# Patient Record
Sex: Female | Born: 1955 | Race: White | Hispanic: No | Marital: Single | Smoking: Never smoker
Health system: Southern US, Community
[De-identification: ages and names within clinical notes are randomized; demographics above are authoritative.]

## PROBLEM LIST (undated history)

## (undated) DIAGNOSIS — K625 Hemorrhage of anus and rectum: Secondary | ICD-10-CM

## (undated) DIAGNOSIS — K6289 Other specified diseases of anus and rectum: Secondary | ICD-10-CM

## (undated) DIAGNOSIS — K5900 Constipation, unspecified: Secondary | ICD-10-CM

## (undated) DIAGNOSIS — T7840XA Allergy, unspecified, initial encounter: Secondary | ICD-10-CM

## (undated) DIAGNOSIS — R011 Cardiac murmur, unspecified: Secondary | ICD-10-CM

## (undated) HISTORY — PX: TUBAL LIGATION: SHX77

## (undated) HISTORY — DX: Allergy, unspecified, initial encounter: T78.40XA

## (undated) HISTORY — DX: Hemorrhage of anus and rectum: K62.5

## (undated) HISTORY — DX: Constipation, unspecified: K59.00

## (undated) HISTORY — DX: Cardiac murmur, unspecified: R01.1

## (undated) HISTORY — DX: Other specified diseases of anus and rectum: K62.89

## (undated) HISTORY — PX: OTHER SURGICAL HISTORY: SHX169

---

## 1993-07-18 HISTORY — PX: DILATION AND CURETTAGE OF UTERUS: SHX78

## 1998-07-07 ENCOUNTER — Other Ambulatory Visit: Admission: RE | Admit: 1998-07-07 | Discharge: 1998-07-07 | Payer: Self-pay | Admitting: Obstetrics & Gynecology

## 1999-04-12 ENCOUNTER — Other Ambulatory Visit: Admission: RE | Admit: 1999-04-12 | Discharge: 1999-04-12 | Payer: Self-pay | Admitting: Obstetrics & Gynecology

## 1999-09-10 ENCOUNTER — Other Ambulatory Visit: Admission: RE | Admit: 1999-09-10 | Discharge: 1999-09-10 | Payer: Self-pay | Admitting: Obstetrics and Gynecology

## 1999-10-19 ENCOUNTER — Inpatient Hospital Stay (HOSPITAL_COMMUNITY): Admission: AD | Admit: 1999-10-19 | Discharge: 1999-10-19 | Payer: Self-pay | Admitting: Obstetrics & Gynecology

## 1999-11-18 ENCOUNTER — Encounter (INDEPENDENT_AMBULATORY_CARE_PROVIDER_SITE_OTHER): Payer: Self-pay | Admitting: Specialist

## 1999-11-18 ENCOUNTER — Inpatient Hospital Stay (HOSPITAL_COMMUNITY): Admission: AD | Admit: 1999-11-18 | Discharge: 1999-11-20 | Payer: Self-pay | Admitting: Family Medicine

## 1999-11-21 ENCOUNTER — Encounter: Admission: RE | Admit: 1999-11-21 | Discharge: 2000-02-19 | Payer: Self-pay | Admitting: Obstetrics & Gynecology

## 1999-12-21 ENCOUNTER — Other Ambulatory Visit: Admission: RE | Admit: 1999-12-21 | Discharge: 1999-12-21 | Payer: Self-pay | Admitting: Obstetrics and Gynecology

## 2000-12-15 ENCOUNTER — Other Ambulatory Visit: Admission: RE | Admit: 2000-12-15 | Discharge: 2000-12-15 | Payer: Self-pay | Admitting: Obstetrics & Gynecology

## 2002-03-22 ENCOUNTER — Other Ambulatory Visit: Admission: RE | Admit: 2002-03-22 | Discharge: 2002-03-22 | Payer: Self-pay | Admitting: Obstetrics & Gynecology

## 2003-08-22 ENCOUNTER — Other Ambulatory Visit: Admission: RE | Admit: 2003-08-22 | Discharge: 2003-08-22 | Payer: Self-pay | Admitting: Obstetrics & Gynecology

## 2004-07-18 HISTORY — PX: COLONOSCOPY: SHX174

## 2004-10-20 ENCOUNTER — Other Ambulatory Visit: Admission: RE | Admit: 2004-10-20 | Discharge: 2004-10-20 | Payer: Self-pay | Admitting: Obstetrics & Gynecology

## 2004-12-10 ENCOUNTER — Ambulatory Visit: Payer: Self-pay | Admitting: Internal Medicine

## 2004-12-16 ENCOUNTER — Ambulatory Visit: Payer: Self-pay | Admitting: Internal Medicine

## 2004-12-16 LAB — HM COLONOSCOPY: HM Colonoscopy: NORMAL

## 2004-12-21 ENCOUNTER — Ambulatory Visit: Payer: Self-pay | Admitting: Gastroenterology

## 2005-01-05 ENCOUNTER — Ambulatory Visit: Payer: Self-pay | Admitting: Gastroenterology

## 2005-02-03 ENCOUNTER — Ambulatory Visit: Payer: Self-pay | Admitting: Gastroenterology

## 2005-09-26 ENCOUNTER — Ambulatory Visit: Payer: Self-pay | Admitting: Internal Medicine

## 2005-11-29 ENCOUNTER — Ambulatory Visit: Payer: Self-pay | Admitting: Internal Medicine

## 2005-12-27 ENCOUNTER — Ambulatory Visit: Payer: Self-pay | Admitting: Internal Medicine

## 2005-12-30 ENCOUNTER — Ambulatory Visit: Payer: Self-pay | Admitting: Family Medicine

## 2006-08-30 ENCOUNTER — Ambulatory Visit: Payer: Self-pay | Admitting: Internal Medicine

## 2007-03-05 ENCOUNTER — Telehealth (INDEPENDENT_AMBULATORY_CARE_PROVIDER_SITE_OTHER): Payer: Self-pay | Admitting: *Deleted

## 2007-03-05 ENCOUNTER — Ambulatory Visit: Payer: Self-pay | Admitting: Internal Medicine

## 2007-03-06 ENCOUNTER — Encounter: Payer: Self-pay | Admitting: Internal Medicine

## 2007-03-08 ENCOUNTER — Encounter (INDEPENDENT_AMBULATORY_CARE_PROVIDER_SITE_OTHER): Payer: Self-pay | Admitting: *Deleted

## 2007-05-21 ENCOUNTER — Ambulatory Visit: Payer: Self-pay | Admitting: Internal Medicine

## 2007-07-05 ENCOUNTER — Ambulatory Visit: Payer: Self-pay | Admitting: Internal Medicine

## 2007-07-05 ENCOUNTER — Telehealth (INDEPENDENT_AMBULATORY_CARE_PROVIDER_SITE_OTHER): Payer: Self-pay | Admitting: *Deleted

## 2008-09-17 ENCOUNTER — Ambulatory Visit: Payer: Self-pay | Admitting: Internal Medicine

## 2008-09-17 ENCOUNTER — Telehealth (INDEPENDENT_AMBULATORY_CARE_PROVIDER_SITE_OTHER): Payer: Self-pay | Admitting: *Deleted

## 2008-10-01 ENCOUNTER — Ambulatory Visit: Payer: Self-pay | Admitting: Internal Medicine

## 2008-10-07 ENCOUNTER — Encounter (INDEPENDENT_AMBULATORY_CARE_PROVIDER_SITE_OTHER): Payer: Self-pay | Admitting: *Deleted

## 2008-11-04 ENCOUNTER — Encounter (INDEPENDENT_AMBULATORY_CARE_PROVIDER_SITE_OTHER): Payer: Self-pay | Admitting: *Deleted

## 2008-11-04 LAB — CONVERTED CEMR LAB
Cholesterol: 206 mg/dL — ABNORMAL HIGH (ref 0–200)
Direct LDL: 127.9 mg/dL
Total CHOL/HDL Ratio: 3
VLDL: 14.4 mg/dL (ref 0.0–40.0)

## 2008-11-25 ENCOUNTER — Ambulatory Visit: Payer: Self-pay | Admitting: Internal Medicine

## 2008-11-25 DIAGNOSIS — L255 Unspecified contact dermatitis due to plants, except food: Secondary | ICD-10-CM

## 2008-12-01 ENCOUNTER — Telehealth (INDEPENDENT_AMBULATORY_CARE_PROVIDER_SITE_OTHER): Payer: Self-pay | Admitting: *Deleted

## 2008-12-12 ENCOUNTER — Telehealth (INDEPENDENT_AMBULATORY_CARE_PROVIDER_SITE_OTHER): Payer: Self-pay | Admitting: *Deleted

## 2010-01-12 ENCOUNTER — Ambulatory Visit: Payer: Self-pay | Admitting: Internal Medicine

## 2010-03-01 ENCOUNTER — Encounter: Admission: RE | Admit: 2010-03-01 | Discharge: 2010-03-01 | Payer: Self-pay | Admitting: Obstetrics & Gynecology

## 2010-04-29 ENCOUNTER — Ambulatory Visit: Payer: Self-pay | Admitting: Internal Medicine

## 2010-04-29 DIAGNOSIS — R5381 Other malaise: Secondary | ICD-10-CM | POA: Insufficient documentation

## 2010-04-29 DIAGNOSIS — R5383 Other fatigue: Secondary | ICD-10-CM

## 2010-04-29 DIAGNOSIS — R490 Dysphonia: Secondary | ICD-10-CM

## 2010-04-30 LAB — CONVERTED CEMR LAB
Basophils Absolute: 0 10*3/uL (ref 0.0–0.1)
Eosinophils Relative: 1.3 % (ref 0.0–5.0)
HCT: 36.2 % (ref 36.0–46.0)
Hemoglobin: 11.9 g/dL — ABNORMAL LOW (ref 12.0–15.0)
Lymphs Abs: 1.6 10*3/uL (ref 0.7–4.0)
Monocytes Relative: 6 % (ref 3.0–12.0)
Neutro Abs: 4.5 10*3/uL (ref 1.4–7.7)
RDW: 16.3 % — ABNORMAL HIGH (ref 11.5–14.6)
Saturation Ratios: 12.4 % — ABNORMAL LOW (ref 20.0–50.0)
Vitamin B-12: 280 pg/mL (ref 211–911)

## 2010-06-04 ENCOUNTER — Encounter: Admission: RE | Admit: 2010-06-04 | Discharge: 2010-06-04 | Payer: Self-pay | Admitting: Obstetrics & Gynecology

## 2010-08-08 ENCOUNTER — Encounter: Payer: Self-pay | Admitting: Obstetrics & Gynecology

## 2010-08-17 NOTE — Assessment & Plan Note (Signed)
Summary: hoarse throat/cbs   Vital Signs:  Patient profile:   55 year old female Weight:      135.8 pounds Temp:     98.0 degrees F oral Pulse rate:   72 / minute Resp:     15 per minute BP sitting:   132 / 80  (left arm) Cuff size:   regular  Vitals Entered By: Shonna Chock CMA (April 29, 2010 1:35 PM) CC: Hoarse, URI symptoms   CC:  Hoarse and URI symptoms.  History of Present Illness:      This is a 55 year old woman who presents with hoarsenessX 2 weeks. The patient reports nasal congestion, sore throat(1 day only ), and productive cough with clear sputum, but denies purulent nasal discharge and earache but some pressure.  The patient denies fever, dyspnea, and wheezing.  The patient denies headache.  The patient denies the following risk factors for Strep sinusitis: unilateral facial pain, tooth pain, and tender adenopathy.  Rx: Neti pot , Nyquil , Zyrtec. Additionally she has had fatigue for months; she is a blood donor.  Current Medications (verified): 1)  None  Allergies (verified): No Known Drug Allergies  Review of Systems General:  Denies chills and sweats. Eyes:  Denies blurring, double vision, and vision loss-both eyes. ENT:  Denies difficulty swallowing. CV:  Denies palpitations. GI:  Denies constipation, diarrhea, and indigestion. Derm:  Denies changes in nail beds, dryness, and hair loss. Neuro:  Denies numbness and tingling. Endo:  Denies cold intolerance and heat intolerance. Allergy:  Denies itching eyes and sneezing.  Physical Exam  General:  well-nourished,in no acute distress; alert,appropriate and cooperative throughout examination Eyes:  No corneal or conjunctival inflammation noted. EOMI. Perrla. Vision grossly normal.No lid lag Ears:  External ear exam shows no significant lesions or deformities.  Otoscopic examination reveals clear canals, tympanic membranes are intact bilaterally without bulging, retraction, inflammation or discharge. Hearing  is grossly normal bilaterally. Nose:  External nasal examination shows no deformity or inflammation. Nasal mucosa are pink and moist without lesions or exudates. Mouth:  Oral mucosa and oropharynx without lesions or exudates.  Teeth in good repair.Slight hoarseness Lungs:  Normal respiratory effort, chest expands symmetrically. Lungs are clear to auscultation, no crackles or wheezes. Heart:  Normal rate and regular rhythm. S1 and S2 normal without gallop, murmur, click, rub .S4 Abdomen:  Bowel sounds positive,abdomen soft and non-tender without masses, organomegaly or hernias noted. Neurologic:  alert & oriented X3 and DTRs symmetrical and normal.  No tremor Skin:  Intact without suspicious lesions or rashes Cervical Nodes:  No lymphadenopathy noted Psych:  memory intact for recent and remote, normally interactive, and good eye contact.     Impression & Recommendations:  Problem # 1:  HOARSENESS (UJW-119.14)  Problem # 2:  BRONCHITIS-ACUTE (ICD-466.0)  Her updated medication list for this problem includes:    Azithromycin 250 Mg Tabs (Azithromycin) .Marland Kitchen... As per pack    Asmanex 14 Metered Doses 220 Mcg/inh Aepb (Mometasone furoate) .Marland Kitchen... 1 inhalation two times a day ; gargle  & after use  Problem # 3:  FATIGUE (ICD-780.79)  Orders: Venipuncture (78295) Specimen Handling (62130) TLB-B12 + Folate Pnl (86578_46962-X52/WUX) TLB-IBC Pnl (Iron/FE;Transferrin) (83550-IBC) TLB-CBC Platelet - w/Differential (85025-CBCD) TLB-TSH (Thyroid Stimulating Hormone) (84443-TSH)  Complete Medication List: 1)  Azithromycin 250 Mg Tabs (Azithromycin) .... As per pack 2)  Asmanex 14 Metered Doses 220 Mcg/inh Aepb (Mometasone furoate) .Marland Kitchen.. 1 inhalation two times a day ; gargle  & after use  Other Orders: Admin 1st Vaccine (29562) Flu Vaccine 53yrs + [phone])  Patient Instructions: 1)  Drink as much NON dairy  fluid as you can tolerate for the next few days. Voice rest as  discussed. Prescriptions: ASMANEX 14 METERED DOSES 220 MCG/INH AEPB (MOMETASONE FUROATE) 1 inhalation two times a day ; gargle  & after use  #sample x 0   Entered and Authorized by:   Marga Melnick MD   Signed by:   Marga Melnick MD on 04/29/2010   Method used:   Samples Given   RxID:   [id number] AZITHROMYCIN 250 MG TABS (AZITHROMYCIN) as per pack  #1 x 0   Entered and Authorized by:   Marga Melnick MD   Signed by:   Marga Melnick MD on 04/29/2010   Method used:   Faxed to ...       [facility] [phone]* (retail)       [address]       [place], [place]  [address]       Ph: [phone]       Fax: [phone]   RxID:   [id number]  Flu Vaccine Consent Questions     Do you have a history of severe allergic reactions to this vaccine? no    Any prior history of allergic reactions to egg and/or gelatin? no    Do you have a sensitivity to the preservative Thimersol? no    Do you have a past history of Guillan-Barre Syndrome? no    Do you currently have an acute febrile illness? no    Have you ever had a severe reaction to latex? no    Vaccine information given and explained to patient? yes    Are you currently pregnant? no    Lot Number:[id number]   Exp Date:01/15/2011   Site Given  Left Deltoid [address]       [place], [place]  [address]       Ph: [phone]       Fax: [phone]   RxID:   [id number]  .lbflu

## 2010-12-03 NOTE — Discharge Summary (Signed)
[facility]  Patient:    Karen Gilmore, Karen Gilmore                          MRN: [id number] Adm. Date:  [id number] Disc. Date: [id number] Attending:  Osborn Coho Dictator:   Leilani Able, P.A.                           Discharge Summary  FINAL DIAGNOSES:              1. Spontaneous vaginal delivery of a female infant                                  with Apgars of 7 and 9.                               2. Desires postpartum sterilization.  PROCEDURE:                    Vaginal delivery and postpartum bilateral tubal ligation.  SURGEON:                      Miguel Aschoff, M.D.  COMPLICATIONS:                None.  HISTORY OF PRESENT ILLNESS:   This 55 year old, gravida 3, para 1-0-1-1, presents at 38 weeks in early labor with spontaneous rupture of membranes.  The patients  prenatal course has been complicated by advanced maternal age.  She did have an  amniocentesis performed with a 46XX genotype.  At this point, the patient is admitted and started on antibiotics for prolonged spontaneous rupture of membranes. The patient had a normal labor curve with Pitocin augmentation.  She had spontaneous vaginal delivery of a 6 pound 3 ounce female infant with Apgars of  and 9 over a second degree perineal laceration.  The delivery went without complications.  The patient still expressed her desires for a postpartum tubal ligation and the complications were discussed with the patient.  She decided to  proceed.  HOSPITAL COURSE:              The patient was taken to the operating room on Nov 19, 1999, by Miguel Aschoff, M.D. where a postpartum Pomeroy tubal sterilization procedure was performed.  The procedure went without complications and the patients postpartum and postoperative course were benign without significant fevers. She was felt ready for discharge on postpartum day #2.  She was sent home on a regular diet, told to decrease activities, told to  continue prenatal vitamins and FeSO4. She was given Darvocet-N 100 one to two every four hours as needed for pain. She was told to follow up in the office in four weeks.  DISCHARGE LABORATORY DATA:    The patient had a hemoglobin of 11.9, white blood  cell count of 14.5. DD:  12/06/99 TD:  12/07/99 Job: [id number] MW/NU272

## 2010-12-03 NOTE — Op Note (Signed)
[facility]  Patient:    Karen Gilmore, Karen Gilmore                          MRN: [id number] Proc. Date: 11/19/99 Adm. Date:  [id number] Attending:  Osborn Coho                           Operative Report  PREOPERATIVE DIAGNOSIS:       Desired sterilization postpartum.  POSTOPERATIVE DIAGNOSIS:      Desired sterilization postpartum.  OPERATION:                    Postpartum Pomeroy tubal sterilization.  SURGEON:                      Miguel Aschoff, M.D.  ASSISTANT:  ANESTHESIA:                   Epidural.  COMPLICATIONS:                None.  ESTIMATED BLOOD LOSS:  INDICATIONS:                  The patient is a 55 year old white female, gravida 3, para 2-0-1-2, who delivered by March 2001.  The patient requested sterilization  procedure for socioeconomic reasons.  She understands the risks and limitations for this procedure and has given her informed consent for postpartum tubal sterilization.  DESCRIPTION OF PROCEDURE:     The patient was taken to the operating room.  Her  previously placed epidural catheter was then reinjected and a satisfactory level of anesthesia was achieved without difficulty.  After this was done, a small infraumbilical incision was made.  The incision was extended down through the subcutaneous tissue with bleeding points being clamped and coagulated as they were encountered.  The fascia was then opened and the peritoneum was revealed below. On entering the peritoneal cavity using [employer] retractors, it was possible to bring the left tube into view and the tube was traced out to its fimbriated end for positive identification.  It was then grasped in its midportion and then this knuckle of tube was ligated with two ligatures of 0 plain gut.  The tube above he ligatures was excised and the tubal stumps were cauterized.  An identical procedure was then carried out on the right side with the tube being  positively identified, doubly ligated, and then the portion of tube excised and tubal stumps cauterized. At this point with excellent hemostasis, it was elected to complete the procedure. Lap counts were taken and found to be correct.  The parietoperitoneum was closed using pursestring suture of 0 Vicryl.  The fascia was closed using running continuous 0 Vicryl suture.  The subcutaneous tissue was closed using interrupted 0 Vicryl suture and the skin incision was closed using subcuticular 4-0 Vicryl suture.  The estimated blood loss was less than 20 cc.  The patient tolerated the procedure well and went to the recovery room in satisfactory condition. DD:  11/19/99 TD:  11/20/99 Job: [id number] EX/[id number]

## 2011-01-07 ENCOUNTER — Encounter: Payer: Self-pay | Admitting: Internal Medicine

## 2011-01-07 ENCOUNTER — Ambulatory Visit (INDEPENDENT_AMBULATORY_CARE_PROVIDER_SITE_OTHER): Payer: BC Managed Care – PPO | Admitting: Internal Medicine

## 2011-01-07 VITALS — BP 116/78 | HR 60 | Temp 97.7°F | Wt 134.4 lb

## 2011-01-07 DIAGNOSIS — R21 Rash and other nonspecific skin eruption: Secondary | ICD-10-CM

## 2011-01-07 MED ORDER — FAMCICLOVIR 500 MG PO TABS: 500.0000 mg | ORAL_TABLET | Freq: Three times a day (TID) | ORAL | Status: AC

## 2011-01-07 NOTE — Patient Instructions (Signed)
Fill Famvir Rx if rash progresses with Cort Aid twice a day as needed

## 2011-01-07 NOTE — Progress Notes (Signed)
  Subjective:    Patient ID: Karen Gilmore, female    DOB: June 23, 1956, 55 y.o.   MRN: [id number]  HPI RASH Location: mid back  Onset: 6/21 as itching   Course: stable Self-treated with: no treatment              History Pruritis: yes,   Tenderness: no  New medications/antibiotics: no  Tick/insect/pet exposure: no  Recent travel: no  New detergent, new clothing, or other topical exposure: no; she saw [facility]  In yard 6/20 Arthralgias: no GU symptoms : no  Red Flags Feeling ill: no  Fever: no  Mouth lesions: no  Facial/tongue swelling/difficulty breathing:  no  Diabetic or immunocompromised: no       Review of Systems     Objective:   Physical Exam Gen.: Healthy and well-nourished in appearance. Alert, appropriate and cooperative throughout exam. Eyes: No corneal or conjunctival inflammation noted.Lungs: Normal respiratory effort; chest expands symmetrically. Lungs are clear to auscultation without rales, wheezes, or increased work of breathing. Heart: Normal rate and rhythm. Normal S1 and S2. No gallop, click, or rub. S4, no murmur. Abdomen: Bowel sounds normal; abdomen soft and nontender. No masses, organomegaly or hernias noted. Lymph: No cervical, or axillary  lymphadenopathy present. Psych: Mood and affect are normal. Normally interactive    Skin: 2.5 X 0.5 cm erythematous papules @ L T10-11 dermatome                                                                                       Assessment & Plan:  #1 papular rash, probable zoster Plan:Famvir if progressive; CortAid twice a day initially

## 2011-05-11 ENCOUNTER — Ambulatory Visit (INDEPENDENT_AMBULATORY_CARE_PROVIDER_SITE_OTHER): Payer: BC Managed Care – PPO

## 2011-05-11 DIAGNOSIS — Z23 Encounter for immunization: Secondary | ICD-10-CM

## 2011-07-22 ENCOUNTER — Ambulatory Visit (INDEPENDENT_AMBULATORY_CARE_PROVIDER_SITE_OTHER): Payer: BC Managed Care – PPO | Admitting: Internal Medicine

## 2011-07-22 ENCOUNTER — Ambulatory Visit (HOSPITAL_BASED_OUTPATIENT_CLINIC_OR_DEPARTMENT_OTHER)
Admission: RE | Admit: 2011-07-22 | Discharge: 2011-07-22 | Disposition: A | Discharge status: Home / Self Care | Payer: BC Managed Care – PPO | Source: Ambulatory Visit | Attending: Internal Medicine | Admitting: Internal Medicine

## 2011-07-22 ENCOUNTER — Encounter: Payer: Self-pay | Admitting: Internal Medicine

## 2011-07-22 DIAGNOSIS — J209 Acute bronchitis, unspecified: Secondary | ICD-10-CM

## 2011-07-22 DIAGNOSIS — J40 Bronchitis, not specified as acute or chronic: Secondary | ICD-10-CM

## 2011-07-22 DIAGNOSIS — R079 Chest pain, unspecified: Secondary | ICD-10-CM | POA: Insufficient documentation

## 2011-07-22 DIAGNOSIS — R0789 Other chest pain: Secondary | ICD-10-CM

## 2011-07-22 LAB — CBC WITH DIFFERENTIAL/PLATELET
Basophils Absolute: 0 10*3/uL (ref 0.0–0.1)
Eosinophils Absolute: 0.2 10*3/uL (ref 0.0–0.7)
HCT: 36.1 % (ref 36.0–46.0)
Hemoglobin: 11.9 g/dL — ABNORMAL LOW (ref 12.0–15.0)
Lymphs Abs: 1.7 10*3/uL (ref 0.7–4.0)
MCHC: 33.1 g/dL (ref 30.0–36.0)
MCV: 90.7 fl (ref 78.0–100.0)
Monocytes Absolute: 0.5 10*3/uL (ref 0.1–1.0)
Neutro Abs: 3.4 10*3/uL (ref 1.4–7.7)
RDW: 15.8 % — ABNORMAL HIGH (ref 11.5–14.6)

## 2011-07-22 MED ORDER — HYDROCODONE-HOMATROPINE 5-1.5 MG/5ML PO SYRP: 5.0000 mL | ORAL_SOLUTION | Freq: Four times a day (QID) | ORAL | Status: AC | PRN

## 2011-07-22 MED ORDER — AZITHROMYCIN 250 MG PO TABS: ORAL_TABLET | ORAL | Status: AC

## 2011-07-22 NOTE — Progress Notes (Signed)
  Subjective:    Patient ID: Karen Gilmore, female    DOB: July 21, 1955, 56 y.o.   MRN: [id number]  HPI CHEST PAIN: Location: SS  Quality: mainly a  "constant" dull but intermittent "wave" of "gripping " pain "like labor pain" Duration: gripping pain < 1 min  Onset : 1/3 @ rest Radiation:no  Better with: no relievers  Worse with: no exacerbating factors Symptoms History of Trauma/lifting: no  Nausea/vomiting: no  Diaphoresis: no  Shortness of breath: yes, suboptimal breath  Pleuritic: no  Cough:yes;onset 1/2, initially dry but now sputum green Edema: no  Orthopnea: yes, see above  PND: no Dizziness: yes, slightly  Palpitations: no  Syncope: no  Indigestion: no . She also denies dysphagia, melena, or rectal bleeding.  Red Flags Worse with exertion: not exerting  Recent Immobility: yes, in bed 1/2 & part of 1/2 & 1/3  Tearing/radiation to back: yes, occasionally       Review of Systems earlier this week she did have some purulent nasal discharge. She denies frontal headache, facial pain, ear pain, or discharge.     Objective:   Physical Exam General appearance:good health ;well nourished; no acute distress or increased work of breathing is present.  No  lymphadenopathy about the head, neck, or axilla noted.   Eyes: No conjunctival inflammation or lid edema is present.   Ears:  External ear exam shows no significant lesions or deformities.  Otoscopic examination reveals clear canals, tympanic membranes are intact bilaterally without bulging, retraction, inflammation or discharge.  Nose:  External nasal examination shows no deformity or inflammation. Nasal mucosa are dry without lesions or exudates. No septal dislocation or deviation.No obstruction to airflow.   Oral exam: Dental hygiene is good; lips and gums are healthy appearing.There is no oropharyngeal erythema or exudate noted.      Heart:  Normal rate and regular rhythm. S1 and S2 normal without gallop, murmur,  rub or  other extra sounds. Slight click left lower sternal border  Lungs:Chest clear to auscultation; no wheezes, rhonchi,rales ,or rubs present.No increased work of breathing.    Vascular: All pulses intact; no bruits or deficits  Extremities:  No cyanosis, edema, or clubbing  noted. Homans sign negative    Skin: Warm & dry w/o jaundice or tenting.          Assessment & Plan:  #1 bronchitis, acute with intermittent purulent sputum. Minor upper respiratory tract symptoms without definitive rhinosinusitis  #2 atypical chest pain; most likely related to cough. There is family history of premature coronary disease. EKG is normal.  Plan: See orders and recommendations

## 2011-07-22 NOTE — Patient Instructions (Signed)
To ER if pain persists or is associaled with Warning Signs as discussed.  Order for x-rays entered into  the computer; these will be performed at [facility]. No appointment is necessary.

## 2012-05-28 ENCOUNTER — Ambulatory Visit (INDEPENDENT_AMBULATORY_CARE_PROVIDER_SITE_OTHER): Payer: BC Managed Care – PPO | Admitting: Internal Medicine

## 2012-05-28 ENCOUNTER — Encounter: Payer: Self-pay | Admitting: Internal Medicine

## 2012-05-28 VITALS — BP 118/80 | HR 70 | Temp 98.4°F | Resp 12 | Ht 65.5 in | Wt 138.4 lb

## 2012-05-28 DIAGNOSIS — R5383 Other fatigue: Secondary | ICD-10-CM

## 2012-05-28 DIAGNOSIS — Z23 Encounter for immunization: Secondary | ICD-10-CM

## 2012-05-28 DIAGNOSIS — E785 Hyperlipidemia, unspecified: Secondary | ICD-10-CM

## 2012-05-28 DIAGNOSIS — R5381 Other malaise: Secondary | ICD-10-CM

## 2012-05-28 NOTE — Progress Notes (Signed)
Subjective:    Patient ID: Karen Gilmore, female    DOB: July 06, 1956, 56 y.o.   MRN: [id number]  HPI  Karen Gilmore is here for a physical;acute issues include intermittent fatigue.      Review of Systems Patient reports no significant  vision/ hearing  changes, adenopathy,fever, weight change,  , swallowing issues, chest pain,palpitations,edema,persistant /recurrent cough, hemoptysis, dyspnea( rest/ exertional/paroxysmal nocturnal), gastrointestinal bleeding(melena, rectal bleeding), abdominal pain, significant heartburn,  bowel changes,GU symptoms(dysuria, hematuria,pyuria, incontinence), Gyn symptoms(abnormal  bleeding , pain),  syncope, focal weakness, memory loss, skin/hair /nail changes,abnormal bruising or bleeding, anxiety,or depression.   She experiences intermittent numbness & tingling in her fingers after sleeping. She relates her fatigue possibly to the demands of her job. 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 Alert, appropriate and cooperative throughout exam. Head: Normocephalic without obvious abnormalities  Eyes: No corneal or conjunctival inflammation noted. Pupils equal round reactive to light and accommodation. Fundal exam is benign without hemorrhages, exudate, papilledema. Extraocular motion intact. Vision grossly normal. Ears: External  ear exam reveals no significant lesions or deformities. Canals clear .TMs normal. Hearing is grossly normal bilaterally. Nose: External nasal exam reveals no deformity or inflammation. Nasal mucosa are pink and moist. No lesions or exudates noted.  Mouth: Oral mucosa and oropharynx reveal no lesions or exudates. Teeth in good repair. Neck: No deformities, masses, or tenderness noted. Range of motion & Thyroid normal. Lungs: Normal respiratory effort; chest expands symmetrically. Lungs are  clear to auscultation without rales, wheezes, or increased work of breathing. Heart: Normal rate and rhythm. Normal S1 and S2. No gallop, click, or rub. S 4 w/o murmur. Abdomen: Bowel sounds normal; abdomen soft and nontender. No masses, organomegaly or hernias noted. Genitalia: Dr Arlyce Dice, Gyn                                                                                   Musculoskeletal/extremities: No deformity or scoliosis noted of  the thoracic or lumbar spine. No clubbing, cyanosis, edema, or deformity noted. Range of motion  normal .Tone & strength  normal.Joints normal. Nail health  good. She is minor crepitus of the left knee without effusion Vascular: Carotid, radial artery, dorsalis pedis and  posterior tibial pulses are full and equal. No bruits present. Neurologic: Alert and oriented x3. Deep tendon reflexes symmetrical and normal.          Skin: Intact without suspicious lesions or rashes. Lymph: No cervical, axillary lymphadenopathy present. Psych: Mood and affect are normal. Normally interactive  Assessment & Plan:  #1 comprehensive physical exam; no acute findings #2 fatigue; probably job-related. No localizing symptoms or signs present Plan: see Orders

## 2012-05-28 NOTE — Patient Instructions (Addendum)
Preventive Health Care: Exercise  30-45  minutes a day, 3-4 days a week. Walking is especially valuable in preventing Osteoporosis. Eat a low-fat diet with lots of fruits and vegetables, up to 7-9 servings per day. Consume less than 30 grams of sugar per day from foods & drinks with High Fructose Corn Syrup as #1,2,3 or #4 on label. Please  consider fasting Labs : BMET,Lipids, hepatic panel, CBC & dif, TSH, free T4. PLEASE BRING THESE INSTRUCTIONS TO FOLLOW UP  LAB APPOINTMENT.This will guarantee correct labs are drawn, eliminating need for repeat blood sampling ( needle sticks ! ). Diagnoses /Codes: V70.0.  If you activate My Chart; the results can be released to you as soon as they populate from the lab. If you choose not to use this program; the labs have to be reviewed, copied & mailed   causing a delay in getting the results to you.

## 2012-05-31 ENCOUNTER — Other Ambulatory Visit (INDEPENDENT_AMBULATORY_CARE_PROVIDER_SITE_OTHER): Payer: BC Managed Care – PPO

## 2012-05-31 DIAGNOSIS — Z0000 Encounter for general adult medical examination without abnormal findings: Secondary | ICD-10-CM

## 2012-05-31 LAB — CBC WITH DIFFERENTIAL/PLATELET
Basophils Absolute: 0.1 10*3/uL (ref 0.0–0.1)
Basophils Relative: 1 % (ref 0.0–3.0)
HCT: 40.8 % (ref 36.0–46.0)
Hemoglobin: 13.2 g/dL (ref 12.0–15.0)
Lymphs Abs: 1.5 10*3/uL (ref 0.7–4.0)
MCHC: 32.3 g/dL (ref 30.0–36.0)
Monocytes Relative: 7.2 % (ref 3.0–12.0)
Neutro Abs: 3.1 10*3/uL (ref 1.4–7.7)
RBC: 4.42 Mil/uL (ref 3.87–5.11)
RDW: 16.9 % — ABNORMAL HIGH (ref 11.5–14.6)

## 2012-05-31 LAB — HEPATIC FUNCTION PANEL
Albumin: 4 g/dL (ref 3.5–5.2)
Alkaline Phosphatase: 66 U/L (ref 39–117)
Bilirubin, Direct: 0 mg/dL (ref 0.0–0.3)

## 2012-05-31 LAB — BASIC METABOLIC PANEL
BUN: 14 mg/dL (ref 6–23)
Chloride: 103 mEq/L (ref 96–112)
Glucose, Bld: 88 mg/dL (ref 70–99)
Potassium: 3.7 mEq/L (ref 3.5–5.1)

## 2012-05-31 LAB — T3, FREE: T3, Free: 3 pg/mL (ref 2.3–4.2)

## 2012-05-31 LAB — LDL CHOLESTEROL, DIRECT: Direct LDL: 128.2 mg/dL

## 2012-05-31 LAB — LIPID PANEL
HDL: 66.8 mg/dL (ref >39.00)
Total CHOL/HDL Ratio: 3
VLDL: 16.4 mg/dL (ref 0.0–40.0)

## 2012-06-04 ENCOUNTER — Other Ambulatory Visit: Payer: BC Managed Care – PPO

## 2012-06-04 ENCOUNTER — Other Ambulatory Visit: Payer: Self-pay | Admitting: Obstetrics & Gynecology

## 2012-06-04 DIAGNOSIS — R928 Other abnormal and inconclusive findings on diagnostic imaging of breast: Secondary | ICD-10-CM

## 2012-06-08 ENCOUNTER — Other Ambulatory Visit: Payer: BC Managed Care – PPO

## 2012-06-15 ENCOUNTER — Other Ambulatory Visit: Payer: BC Managed Care – PPO

## 2012-06-15 ENCOUNTER — Ambulatory Visit
Admission: RE | Admit: 2012-06-15 | Discharge: 2012-06-15 | Disposition: A | Discharge status: Home / Self Care | Payer: BC Managed Care – PPO | Source: Ambulatory Visit | Attending: Obstetrics & Gynecology | Admitting: Obstetrics & Gynecology

## 2012-06-15 DIAGNOSIS — R928 Other abnormal and inconclusive findings on diagnostic imaging of breast: Secondary | ICD-10-CM

## 2012-09-04 ENCOUNTER — Ambulatory Visit (INDEPENDENT_AMBULATORY_CARE_PROVIDER_SITE_OTHER): Payer: BC Managed Care – PPO | Admitting: Family Medicine

## 2012-09-04 ENCOUNTER — Encounter: Payer: Self-pay | Admitting: Family Medicine

## 2012-09-04 VITALS — BP 112/62 | HR 80 | Temp 98.4°F | Wt 132.2 lb

## 2012-09-04 DIAGNOSIS — K648 Other hemorrhoids: Secondary | ICD-10-CM

## 2012-09-04 DIAGNOSIS — K644 Residual hemorrhoidal skin tags: Secondary | ICD-10-CM

## 2012-09-04 MED ORDER — HYDROCORTISONE ACETATE 25 MG RE SUPP: 25.0000 mg | Freq: Two times a day (BID) | RECTAL | Status: DC

## 2012-09-04 MED ORDER — HYDROCORTISONE ACE-PRAMOXINE 1-1 % RE FOAM: 1.0000 | Freq: Two times a day (BID) | RECTAL | Status: DC

## 2012-09-04 NOTE — Patient Instructions (Signed)

## 2012-09-04 NOTE — Assessment & Plan Note (Signed)
Refer to surgery for hemorrhoidectomy anusol and proctofoam rx

## 2012-09-04 NOTE — Progress Notes (Signed)
  Subjective:    Patient ID: Karen Gilmore, female    DOB: 05/17/1956, 57 y.o.   MRN: [id number]  HPI Pt here c/o hx of int and ext hemorrhoids that bleed on occasion but have gotten bigger and are really uncomfortable now.  She doesn't like talking about it so she is very uncomfortable. No other complaints. Pt has been inc fiber in her diet and drinking water.     Review of Systems    as above Objective:   Physical Exam BP 112/62  Pulse 80  Temp(Src) 98.4 F (36.9 C) (Oral)  Wt 132 lb 3.2 oz (59.966 kg)  BMI 21.66 kg/m2  SpO2 98% General appearance: alert, cooperative, appears stated age and mild distress Rectal--  Large ext hemorrhoids, no bleeding now               DRE not attempted because of size of hemorrhoids and how uncomfortable pt is.       Assessment & Plan:

## 2012-09-05 ENCOUNTER — Ambulatory Visit (INDEPENDENT_AMBULATORY_CARE_PROVIDER_SITE_OTHER): Payer: BC Managed Care – PPO | Admitting: General Surgery

## 2012-09-05 ENCOUNTER — Encounter (INDEPENDENT_AMBULATORY_CARE_PROVIDER_SITE_OTHER): Payer: Self-pay | Admitting: General Surgery

## 2012-09-05 VITALS — BP 122/72 | HR 68 | Temp 98.0°F | Resp 16 | Ht 64.5 in | Wt 130.0 lb

## 2012-09-05 DIAGNOSIS — K645 Perianal venous thrombosis: Secondary | ICD-10-CM

## 2012-09-05 NOTE — Progress Notes (Signed)
Patient ID: Karen Gilmore, female   DOB: Oct 05, 1955, 57 y.o.   MRN: [id number]  Chief Complaint  Patient presents with  . Rectal Pain    Evaluate hemorrhoids    HPI Karen Gilmore is a 57 y.o. female.  This patient is referred by Dr. Laury Axon for evaluation of thrombosed hemorrhoids. This patient has a history of long-standing hemorrhoids which do cause some intermittent discomfort and flareups of pain but she's had these for quite some time. She says that she usually has some discomfort and bulging of her hemorrhoids after bowel movements and about once per year will have increased pain and bulging but this resolves. She is normally constipated but over the last few weeks has increased her fiber intake and water intake and she says her bowels have improved moving her bowels about once a day or at least every other day he however, despite improvement in her bowel habits, she still has had persistent symptoms from her hemorrhoids. She says that she has discomfort after each bowel movement as well as some intermittent bleeding of some right red blood on the tissue with wiping. On Monday she she had increased pain and she saw her physician yesterday but she says that this is improved today. She says that she had a normal colonoscopy about 7-8 years ago except for internal and external hemorrhoids.  HPI  Past Medical History  Diagnosis Date  . 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 G 3 p 2    . Tubal ligation      Dr Arlyce Dice  . Colonoscopy  2006    hemorrhoids;[facility]    Family History  Problem Relation Age of Onset  . Coronary artery disease Father     MI @ 91  . Heart attack Paternal Grandfather     early 62s  . Breast cancer Mother   . Diabetes Neg Hx   . Hypertension Neg Hx   . Stroke Neg Hx     Social History History  Substance Use Topics  . Smoking status: Never Smoker   . Smokeless tobacco: Not on file  .  Alcohol Use: 1.8 oz/week    3 Glasses of wine per week     Comment:  socially    No Known Allergies  Current Outpatient Prescriptions  Medication Sig Dispense Refill  . hydrocortisone (ANUSOL-HC) 25 MG suppository Place 1 suppository (25 mg total) rectally 2 (two) times daily.  12 suppository  0  . hydrocortisone-pramoxine (PROCTOFOAM-HC) rectal foam Place 1 applicator rectally 2 (two) times daily.  10 g  0   No current facility-administered medications for this visit.    Review of Systems Review of Systems All other review of systems negative or noncontributory except as stated in the HPI  Blood pressure 122/72, pulse 68, temperature 98 F (36.7 C), temperature source Temporal, resp. rate 16, height 5' 4.5" (1.638 m), weight 130 lb (58.968 kg).  Physical Exam Physical Exam Physical Exam  Nursing note and vitals reviewed. Constitutional: She is oriented to person, place, and time. She appears well-developed and well-nourished. No distress. Anxious. HENT:  Head: Normocephalic and atraumatic.  Mouth/Throat: No oropharyngeal exudate.  Eyes: Conjunctivae and EOM are normal. Pupils are equal, round, and reactive to light. Right eye exhibits no discharge. Left eye exhibits no discharge. No scleral icterus.  Neck: Normal range of motion. Neck supple. No tracheal deviation present.  Cardiovascular: Normal  rate, regular rhythm, normal heart sounds and intact distal pulses.   Pulmonary/Chest: Effort normal and breath sounds normal. No stridor. No respiratory distress. She has no wheezes.  Abdominal: Soft. Bowel sounds are normal. She exhibits no distension and no mass. There is no tenderness. There is no rebound and no guarding.  Musculoskeletal: Normal range of motion. She exhibits no edema and no tenderness.  Neurological: She is alert and oriented to person, place, and time.  Skin: Skin is warm and dry. No rash noted. She is not diaphoretic. No erythema. No pallor.  Psychiatric: She  has a normal mood and affect. Her behavior is normal. Judgment and thought content normal.  Rectal: she has circumferential external hemorrhoids with some mild prolapse.  She does have evidence of some thrombosis on the left lateral aspect. There is no evidence of active bleeding or any masses. Rectal exam was deferred today due to patient anxiety and discomfort  Data Reviewed   Assessment    Thrombosed hemorrhoids She does have fairly significant circumferential hemorrhoid disease but evidence of thrombosis.  Had a long discussion with her regarding the possible treatment options both conservative and surgical for her hemorrhoids. We discussed the need for increasing fiber intake to 2030 g of fiber diet and increasing water intake which she has done with improvement in her bowel habits. However, she has not really received much improvement from her hemorrhoid disease despite the improvement in her bowel habits. Because of this, I did offer her surgical treatment and I think that she would be a fine candidate for the [facility] hemorrhoidectomy.  Though she has thrombosis today, she is very starting to feel better and this is fairly diffuse that I think limited thrombectomy and incision and drainage in the office would be difficult to give her adequate relief. I did offer her a stool softener and pain medication but she declined this    Plan    She will continue with her high-fiber diet and fluid intake and if she continues to have symptoms from her hemorrhoids then she will coming back and we can discuss further surgical treatment.        Lodema Pilot DAVID 09/05/2012, 4:21 PM

## 2012-09-05 NOTE — Patient Instructions (Signed)
Increase fiber intake to 20-30 grams of fiber/day Increase water intake Follow up in 4-6 weeks if no improvement

## 2012-09-10 ENCOUNTER — Ambulatory Visit (INDEPENDENT_AMBULATORY_CARE_PROVIDER_SITE_OTHER): Payer: BC Managed Care – PPO | Admitting: Surgery

## 2013-05-23 ENCOUNTER — Other Ambulatory Visit: Payer: Self-pay

## 2013-06-11 ENCOUNTER — Telehealth: Payer: Self-pay

## 2013-06-11 NOTE — Telephone Encounter (Addendum)
Medication and allergies: reviewed and updated  90 day supply/mail order: na Local pharmacy: [facility]   Immunizations due:  Tdap  A/P:   No changes to FH or PSH CCS--per patient 6-7 years ago Gyn--female care [facility]--07/2011--due for recheck per [facility] notes  To Discuss with Provider: Bone Density test

## 2013-06-11 NOTE — Telephone Encounter (Signed)
Left message for call back  identifiable     

## 2013-06-12 ENCOUNTER — Ambulatory Visit (INDEPENDENT_AMBULATORY_CARE_PROVIDER_SITE_OTHER): Payer: BC Managed Care – PPO | Admitting: Internal Medicine

## 2013-06-12 ENCOUNTER — Encounter: Payer: Self-pay | Admitting: Internal Medicine

## 2013-06-12 VITALS — BP 124/77 | HR 62 | Temp 97.9°F | Ht 64.5 in | Wt 134.0 lb

## 2013-06-12 DIAGNOSIS — Z0000 Encounter for general adult medical examination without abnormal findings: Secondary | ICD-10-CM

## 2013-06-12 DIAGNOSIS — N959 Unspecified menopausal and perimenopausal disorder: Secondary | ICD-10-CM

## 2013-06-12 DIAGNOSIS — E785 Hyperlipidemia, unspecified: Secondary | ICD-10-CM

## 2013-06-12 DIAGNOSIS — Z23 Encounter for immunization: Secondary | ICD-10-CM

## 2013-06-12 LAB — LIPID PANEL
Cholesterol: 213 mg/dL — ABNORMAL HIGH (ref 0–200)
HDL: 63.4 mg/dL (ref >39.00)
Total CHOL/HDL Ratio: 3
VLDL: 14.4 mg/dL (ref 0.0–40.0)

## 2013-06-12 LAB — CBC WITH DIFFERENTIAL/PLATELET
Basophils Absolute: 0.1 10*3/uL (ref 0.0–0.1)
Eosinophils Absolute: 0.3 10*3/uL (ref 0.0–0.7)
HCT: 38.4 % (ref 36.0–46.0)
Lymphocytes Relative: 29 % (ref 12.0–46.0)
Lymphs Abs: 1.6 10*3/uL (ref 0.7–4.0)
MCHC: 33.6 g/dL (ref 30.0–36.0)
Monocytes Relative: 7 % (ref 3.0–12.0)
Neutro Abs: 3.2 10*3/uL (ref 1.4–7.7)
Platelets: 220 10*3/uL (ref 150.0–400.0)
RDW: 13.3 % (ref 11.5–14.6)

## 2013-06-12 LAB — BASIC METABOLIC PANEL
BUN: 19 mg/dL (ref 6–23)
CO2: 28 mEq/L (ref 19–32)
Calcium: 8.9 mg/dL (ref 8.4–10.5)
GFR: 71.65 mL/min (ref >60.00)
Glucose, Bld: 76 mg/dL (ref 70–99)

## 2013-06-12 LAB — HEPATIC FUNCTION PANEL
AST: 21 U/L (ref 0–37)
Albumin: 4 g/dL (ref 3.5–5.2)
Total Bilirubin: 0.7 mg/dL (ref 0.3–1.2)

## 2013-06-12 LAB — LDL CHOLESTEROL, DIRECT: Direct LDL: 137.7 mg/dL

## 2013-06-12 NOTE — Progress Notes (Signed)
  Subjective:    Patient ID: Karen Gilmore, female    DOB: 10/05/1955, 57 y.o.   MRN: [id number]  HPI  She is here for a physical;acute issues denied.     Review of Systems  A heart healthy diet is followed; exercise encompasses 45-60 minutes 5  times per week as walking , treadmill, tennis, body pump without symptoms.  Family history is indefinite for premature coronary disease. Advanced cholesterol testing reveals  LDL goal is less than 115 ; ideally < 85 . To date no need for statin.  Low dose ASA not  taken Specifically denied are  chest pain, palpitations, dyspnea, or claudication.        Objective:   Physical Exam Gen.: Thin but healthy and well-nourished in appearance. Alert, appropriate and cooperative throughout exam.Appears younger than stated age  Head: Normocephalic without obvious abnormalities  Eyes: No corneal or conjunctival inflammation noted. Pupils equal round reactive to light and accommodation. Extraocular motion intact.  Ears: External  ear exam reveals no significant lesions or deformities. Canals clear .TMs normal. Hearing is grossly normal bilaterally. Nose: External nasal exam reveals no deformity or inflammation. Nasal mucosa are pink and moist. No lesions or exudates noted.  Mouth: Oral mucosa and oropharynx reveal no lesions or exudates. Teeth in good repair. Neck: No deformities, masses, or tenderness noted. Range of motion normal. Thyroid slightly grainy w/o nodules. Lungs: Normal respiratory effort; chest expands symmetrically. Lungs are clear to auscultation without rales, wheezes, or increased work of breathing. Heart: Normal rate and rhythm. Normal S1 and S2. No gallop,  or rub.Apical click w/o murmur. Abdomen: Bowel sounds normal; abdomen soft and nontender. No masses, organomegaly or hernias noted.Aorta palpable ; no AAA Genitalia:  as per Gyn                                  Musculoskeletal/extremities: No deformity or scoliosis noted of  the thoracic  or lumbar spine.   No clubbing, cyanosis, edema, or significant extremity  deformity noted. Range of motion normal .Tone & strength normal. Hand joints normal . Fingernail  health good. Able to lie down & sit up w/o help. Negative SLR bilaterally Vascular: Carotid, radial artery, dorsalis pedis and  posterior tibial pulses are full and equal. No bruits present. Neurologic: Alert and oriented x3. Deep tendon reflexes symmetrical and normal.        Skin: Intact without suspicious lesions or rashes. Lymph: No cervical, axillary lymphadenopathy present. Psych: Mood and affect are normal. Normally interactive                                                                                        Assessment & Plan:  #1 comprehensive physical exam; no acute findings  Plan: see Orders  & Recommendations

## 2013-06-12 NOTE — Patient Instructions (Signed)
Your next office appointment will be determined based upon review of your pending labs & BMD. Those instructions will be transmitted to you through My Chart . Use tissue to cleanse his stool; after bowel movements clean with TUCKS  or Baby Wipes. Sitz baths followed by the anti-inflammatory medication 2 to 3 times a day to shrink the hemorrhoids. Stay well hydrated and avoid popcorn and some other materials which might aggravate hemorrhoids. Miralax every 3 rd day as needed for constipation.

## 2013-06-12 NOTE — Progress Notes (Signed)
Pre visit review using our clinic review tool, if applicable. No additional management support is needed unless otherwise documented below in the visit note. 

## 2013-06-21 ENCOUNTER — Telehealth: Payer: Self-pay | Admitting: Internal Medicine

## 2013-06-21 NOTE — Telephone Encounter (Signed)
Patient states that she was viewing her lab results on MyChart and wants to discuss her results to address questions she has. Please advise.

## 2013-06-26 NOTE — Telephone Encounter (Signed)
Lm @ (4:34pm) asking the pt to RTC regarding note below.//AB/CMA

## 2013-06-28 NOTE — Telephone Encounter (Signed)
Patient called again to return angela's phone call back.

## 2013-07-01 NOTE — Telephone Encounter (Signed)
Lm @ (11:49am) asking the pt to RTC regarding note below.//AB/CMA

## 2013-07-05 NOTE — Telephone Encounter (Signed)
Spoke with the pt and she wanted to discuss her recent labs.  Pt stated that she read her lab results on MyChart and she stated that she has read the book (twice),she has changed her diet and she is exercising 5x per week.  She stated that she does not want to go on a statin medication.  She wants to know if she could just continue with the changes in her diet and exercising and recheck her cholesterol again.  She also mentioned that she was seen on 01-06-13 and was dx with shingles and was told that she should get a shingles vaccine in 2 years,so she wants to know if she should get the vaccine.  Pt also wanted to know if she should have hormone testing done since she has not had a period in a year.  She stated that it was mentioned to her when she had this last blood work done.  Please advise.//AB/CMA

## 2013-07-06 NOTE — Telephone Encounter (Signed)
   I recommend waiting until June 2016 to take the shingles shot as she has some natural immunity after the acute episode of shingles  Her present LDL of 137 would be associated with approximate 20% increased long-term risk. That is a risk, not a certainty. As her exercise and diet are optimal she has to take this risk or consider the statins. This is important enough that she may wish to come in and discuss risks & options at an office visit to explain the significance of the advanced cholesterol testing she's had  Certainly we can do [facility] here although typically the gynecologist performs this at the gynecologic follow-up.

## 2013-07-08 ENCOUNTER — Ambulatory Visit (INDEPENDENT_AMBULATORY_CARE_PROVIDER_SITE_OTHER)
Admission: RE | Admit: 2013-07-08 | Discharge: 2013-07-08 | Disposition: A | Discharge status: Home / Self Care | Payer: BC Managed Care – PPO | Source: Ambulatory Visit | Attending: Internal Medicine | Admitting: Internal Medicine

## 2013-07-08 DIAGNOSIS — N959 Unspecified menopausal and perimenopausal disorder: Secondary | ICD-10-CM

## 2013-07-09 NOTE — Telephone Encounter (Signed)
Spoke with the pt and informed her of Dr. Frederik Pear recommendation below.  Pt understood and agreed.//AB/CMA

## 2013-07-09 NOTE — Telephone Encounter (Signed)
LM @ (9:01am) asking the pt to RTC regarding note below.//AB/CMA

## 2013-07-17 ENCOUNTER — Encounter: Payer: Self-pay | Admitting: Internal Medicine

## 2013-07-17 ENCOUNTER — Ambulatory Visit (INDEPENDENT_AMBULATORY_CARE_PROVIDER_SITE_OTHER): Payer: BC Managed Care – PPO | Admitting: Internal Medicine

## 2013-07-17 VITALS — BP 134/88 | HR 64 | Temp 98.1°F | Wt 128.4 lb

## 2013-07-17 DIAGNOSIS — K623 Rectal prolapse: Secondary | ICD-10-CM

## 2013-07-17 MED ORDER — TRAMADOL HCL 50 MG PO TABS: 50.0000 mg | ORAL_TABLET | Freq: Four times a day (QID) | ORAL | Status: DC | PRN

## 2013-07-17 NOTE — Patient Instructions (Signed)
Stay on clear liquids for 48-72 hours  until seen.This would include  jello, sherbert (NOT ice cream), Lipton's chicken noodle soup(NOT cream based soups),Gatorade Lite, flat Ginger ale (without High Fructose Corn Syrup),dry toast or crackers, baked potato.No milk , dairy or grease .Please take the probiotic , Align, every day to replace the normal bacteria which  are necessary for formation of normal stool and processing of food.

## 2013-07-17 NOTE — Progress Notes (Signed)
Pre visit review using our clinic review tool, if applicable. No additional management support is needed unless otherwise documented below in the visit note. 

## 2013-07-17 NOTE — Progress Notes (Signed)
   Subjective:    Patient ID: Karen Gilmore, female    DOB: 1955-11-25, 57 y.o.   MRN: [id number]  HPI   Her symptoms began 07/15/13 as some stomach cramping & the strong urge to have a bowel movement.The only trigger may have been increased sweets over holidays. She had not had her usual daily bowel movement 12/28. On 12/29 she had 3 BMs which exacerbated her  internal and external hemorrhoids. These were noted to protrude.  She knows of no specific trigger for this change in her bowels.  The symptoms are progressive the next 36 hours.  Soaking in hot bath did result in some short-term improvement. She's been using hot baths multiple times a day. She's also used ice packs, Preparation H, Noxzema, and a banana peel supplement.She is slightly better today.   Her colonoscopy in 2006 did reveal the internal and external hemorrhoids. She has no history of gluten lactose intolerance.    Review of Systems As stated she typically has a bowel movement every day and does not consider herself to be constipated.  She does not have loose- diarrheal stools.      Objective:   Physical Exam General appearance is one of good health and nourishment w/o distress.  Eyes: No conjunctival inflammation or scleral icterus is present.     Heart:  Normal rate and regular rhythm. S1 and S2 normal without gallop, murmur, click, rub or other extra sounds     Lungs:Chest clear to auscultation; no wheezes, rhonchi,rales ,or rubs present.No increased work of breathing.   Abdomen: bowel sounds normal, soft and non-tender without masses, organomegaly or hernias noted.  No guarding or rebound . No tenderness over the flanks to percussion  Musculoskeletal: Able to lie flat and sit up without help.  Skin:Warm & dry.  Intact without suspicious lesions or rashes ; no jaundice or tenting  Lymphatic: No lymphadenopathy is noted about the head, neck, axilla.  Rectal: 4X2 cm prolapse of rectal mucosa                Assessment & Plan:  #1painful rectal prolapse See orders

## 2013-07-19 ENCOUNTER — Encounter (INDEPENDENT_AMBULATORY_CARE_PROVIDER_SITE_OTHER): Payer: Self-pay | Admitting: General Surgery

## 2013-07-19 ENCOUNTER — Ambulatory Visit (INDEPENDENT_AMBULATORY_CARE_PROVIDER_SITE_OTHER): Payer: BC Managed Care – PPO | Admitting: General Surgery

## 2013-07-19 DIAGNOSIS — K648 Other hemorrhoids: Secondary | ICD-10-CM

## 2013-07-19 DIAGNOSIS — K644 Residual hemorrhoidal skin tags: Secondary | ICD-10-CM

## 2013-07-19 MED ORDER — HYDROCORTISONE 2.5 % RE CREA: 1.0000 "application " | TOPICAL_CREAM | Freq: Two times a day (BID) | RECTAL | Status: DC | PRN

## 2013-07-19 NOTE — Patient Instructions (Signed)
HEMORRHOIDS    Did you know... Hemorrhoids are one of the most common ailments known.  More than half the population will develop hemorrhoids, usually after age 56.  Millions of Americans currently suffer from hemorrhoids.  The average person suffers in silence for a long period before seeking medical care.  Today's treatment methods make some types of hemorrhoid removal much less painful.  What are hemorrhoids? Often described as "varicose veins of the anus and rectum", hemorrhoids are enlarged, bulging blood vessels in and about the anus and lower rectum. There are two types of hemorrhoids: external and internal, which refer to their location.  External (outside) hemorrhoids develop near the anus and are covered by very sensitive skin. These are usually painless. However, if a blood clot (thrombosis) develops in an external hemorrhoid, it becomes a painful, hard lump. The external hemorrhoid may bleed if it ruptures. Internal (inside) hemorrhoids develop within the anus beneath the lining. Painless bleeding and protrusion during bowel movements are the most common symptom. However, an internal hemorrhoid can cause severe pain if it is completely "prolapsed" - protrudes from the anal opening and cannot be pushed back inside.   What causes hemorrhoids? An exact cause is unknown; however, the upright posture of humans alone forces a great deal of pressure on the rectal veins, which sometimes causes them to bulge. Other contributing factors include:  . Aging  . Chronic constipation or diarrhea  . Pregnancy  . Heredity  . Straining during bowel movements  . Faulty bowel function due to overuse of laxatives or enemas . Spending long periods of time (e.g., reading) on the toilet  Whatever the cause, the tissues supporting the vessels stretch. As a result, the vessels dilate; their walls become thin and bleed. If the stretching and pressure continue, the weakened vessels protrude.  What are the  symptoms? If you notice any of the following, you could have hemorrhoids:  . Bleeding during bowel movements  . Protrusion during bowel movements . Itching in the anal area  . Pain  . Sensitive lump(s)  How are hemorrhoids treated? Mild symptoms can be relieved frequently by increasing the amount of fiber (e.g., fruits, vegetables, breads and cereals) and fluids in the diet. Eliminating excessive straining reduces the pressure on hemorrhoids and helps prevent them from protruding. A sitz bath - sitting in plain warm water for about 10 minutes - can also provide some relief . With these measures, the pain and swelling of most symptomatic hemorrhoids will decrease in two to seven days, and the firm lump should recede within four to six weeks. In cases of severe or persistent pain from a thrombosed hemorrhoid, your physician may elect to remove the hemorrhoid containing the clot with a small incision. Performed under local anesthesia as an outpatient, this procedure generally provides relief. Severe hemorrhoids may require special treatment, much of which can be performed on an outpatient basis.  . Ligation - the rubber band treatment - works effectively on internal hemorrhoids that protrude with bowel movements. A small rubber band is placed over the hemorrhoid, cutting off its blood supply. The hemorrhoid and the band fall off in a few days and the wound usually heals in a week or two. This procedure sometimes produces mild discomfort and bleeding and may need to be repeated for a full effect.  There is a more intense version of this procedure that is done in the OR as outpatient surgery called THD.  It involves identifying blood vessels leading to the   hemorrhoids and then tying them off with sutures.  This method is a little more painful than rubber band ligation but less painful than traditional hemorrhoidectomy and usually does not have to be repeated.  It is best for internal hemorrhoids that  bleed.  Rubber Band Ligation of Internal Hemorrhoids:  A.  Bulging, bleeding, internal hemorrhoid B.  Rubber band applied at the base of the hemorrhoid C.  About 7 days later, the banded hemorrhoid has fallen off leaving a small scar (arrow)  . Injection and Coagulation can also be used on bleeding hemorrhoids that do not protrude. Both methods are relatively painless and cause the hemorrhoid to shrivel up. . Hemorrhoidectomy - surgery to remove the hemorrhoids - is the most complete method for removal of internal and external hemorrhoids. It is necessary when (1) clots repeatedly form in external hemorrhoids; (2) ligation fails to treat internal hemorrhoids; (3) the protruding hemorrhoid cannot be reduced; or (4) there is persistent bleeding. A hemorrhoidectomy removes excessive tissue that causes the bleeding and protrusion. It is done under anesthesia using sutures, and may, depending upon circumstances, require hospitalization and a period of inactivity. Laser hemorrhoidectomies do not offer any advantage over standard operative techniques. They are also quite expensive, and contrary to popular belief, are no less painful.  Do hemorrhoids lead to cancer? No. There is no relationship between hemorrhoids and cancer. However, the symptoms of hemorrhoids, particularly bleeding, are similar to those of colorectal cancer and other diseases of the digestive system. Therefore, it is important that all symptoms are investigated by a physician specially trained in treating diseases of the colon and rectum and that everyone 50 years or older undergo screening tests for colorectal cancer. Do not rely on over-the-counter medications or other self-treatments. See a colorectal surgeon first so your symptoms can be properly evaluated and effective treatment prescribed.  2012 American Society of Colon & Rectal Surgeons      GETTING TO GOOD BOWEL HEALTH. Irregular bowel habits such as constipation can lead  to many problems over time.  Having one soft bowel movement a day is the most important way to prevent further problems.  The anorectal canal is designed to handle stretching and feces to safely manage our ability to get rid of solid waste (feces, poop, stool) out of our body.  BUT, hard constipated stools can act like ripping concrete bricks causing inflamed hemorrhoids, anal fissures, abdominal pain and bloating.     The goal: ONE SOFT BOWEL MOVEMENT A DAY!  To have soft, regular bowel movements:    Drink at least 8 tall glasses of water a day.     Take plenty of fiber.  Fiber is the undigested part of plant food that passes into the colon, acting s "natures broom" to encourage bowel motility and movement.  Fiber can absorb and hold large amounts of water. This results in a larger, bulkier stool, which is soft and easier to pass. Work gradually over several weeks up to 6 servings a day of fiber (25g a day even more if needed) in the form of: o Vegetables -- Root (potatoes, carrots, turnips), leafy green (lettuce, salad greens, celery, spinach), or cooked high residue (cabbage, broccoli, etc) o Fruit -- Fresh (unpeeled skin & pulp), Dried (prunes, apricots, cherries, etc ),  or stewed ( applesauce)  o Whole grain breads, pasta, etc (whole wheat)  o Bran cereals    Bulking Agents -- This type of water-retaining fiber generally is easily obtained each day by one   of the following:  o Psyllium bran -- The psyllium plant is remarkable because its ground seeds can retain so much water. This product is available as Metamucil, Konsyl, Effersyllium, Per Diem Fiber, or the less expensive generic preparation in drug and health food stores. Although labeled a laxative, it really is not a laxative.  o Methylcellulose -- This is another fiber derived from wood which also retains water. It is available as Citrucel. o Polyethylene Glycol - and "artificial" fiber commonly called Miralax or Glycolax.  It is helpful for  people with gassy or bloated feelings with regular fiber o Flax Seed - a less gassy fiber than psyllium   No reading or other relaxing activity while on the toilet. If bowel movements take longer than 5 minutes, you are too constipated.   AVOID CONSTIPATION.  High fiber and water intake usually takes care of this.  Sometimes a laxative is needed to stimulate more frequent bowel movements, but    Laxatives are not a good long-term solution as it can wear the colon out. o Osmotics (Milk of Magnesia, Fleets phosphosoda, Magnesium citrate, MiraLax, GoLytely) are safer than  o Stimulants (Senokot, Castor Oil, Dulcolax, Ex Lax)    o Do not take laxatives for more than 7days in a row.    IF SEVERELY CONSTIPATED, try a Bowel Retraining Program: o Do not use laxatives.  o Eat a diet high in roughage, such as bran cereals and leafy vegetables.  o Drink six (6) ounces of prune or apricot juice each morning.  o Eat two (2) large servings of stewed fruit each day.  o Take one (1) heaping tablespoon of a psyllium-based bulking agent twice a day. Use sugar-free sweetener when possible to avoid excessive calories.  o Eat a normal breakfast.  o Set aside 15 minutes after breakfast to sit on the toilet, but do not strain to have a bowel movement.  o If you do not have a bowel movement by the third day, use an enema and repeat the above steps.  Fiber Chart  You should 25-30g of fiber per day and drinking 8 glasses of water to help your bowels move regularly.  In the chart below you can look up how much fiber you are getting in an average day.  If you are not getting enough fiber, you should add a fiber supplement to your diet.  Examples of this include Metamucil, FiberCon and Citrucel.  These can be purchased at your local grocery store or pharmacy.      http://[email or web address].edu/offices/health/nutritioncoach/AtoZ/handouts/[email or web address]  

## 2013-07-19 NOTE — Progress Notes (Signed)
Chief Complaint  Patient presents with  . prolapsed rectum    HISTORY: Karen Gilmore is a 58 y.o. female who presents to the office with rectal pain.  Other symptoms include occasional bleeding and mucus drainage.  This had been occurring for several yrs on and off.  She has tried dietary modifications and changing her bathroom habits in the past with some success.  Constipation makes the symptoms worse.   It is continuous in nature.  Her bowel habits are usually regular and her bowel movements are sometimes hard and she spends a lot of time straining.  Her fiber intake is dietary and she drinks a lot of water.  Her last colonoscopy was 2006.  She does have prolapsing tissue.    Her most recent episode occurred approximately 5 days ago when she developed severe anal pain after having gone several days without a bowel movement. She then had 3 bowel movements in one day which prompted her anal pain. She was seen by her primary care physician who was concerned for rectal prolapse. She is here today to have this investigated.  Past Medical History  Diagnosis Date  . 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 Rectal bleeding   . Constipation   . Rectal pain       Past Surgical History  Procedure Laterality Date  . G 3 p 2    . Tubal ligation      Dr Alden Hipp  . Colonoscopy  2006    hemorrhoids;[facility]        Current Outpatient Prescriptions  Medication Sig Dispense Refill  . Probiotic Product (ALIGN PO) Take by mouth daily.      . hydrocortisone (ANUSOL-HC) 2.5 % rectal cream Place 1 application rectally 2 (two) times daily as needed for hemorrhoids or itching. Apply around anus for irritated & painful hemorrhoids  15 g  2  . traMADol (ULTRAM) 50 MG tablet Take 1 tablet (50 mg total) by mouth every 6 (six) hours as needed.  30 tablet  0   No current facility-administered medications for this visit.      No Known Allergies    Family History  Problem Relation Age  of Onset  . Heart attack Father 57  . Heart disease Father     MI at age 34  . Heart attack Paternal Grandfather     early 62s (NOT definite)  . Breast cancer Mother   . Cancer Mother     breast  . Diabetes Neg Hx   . Hypertension Neg Hx   . Stroke Neg Hx     History   Social History  . Marital Status: Single    Spouse Name: Karen Gilmore    Number of Children: Karen Gilmore  . Years of Education: Karen Gilmore   Social History Main Topics  . Smoking status: Never Smoker   . Smokeless tobacco: None  . Alcohol Use: 1.8 oz/week    3 Glasses of wine per week     Comment:  socially  . Drug Use: No  . Sexual Activity: None   Other Topics Concern  . None   Social History Narrative  . None      REVIEW OF SYSTEMS - PERTINENT POSITIVES ONLY: Review of Systems - General ROS: negative for - chills, fever or weight loss Hematological and Lymphatic ROS: negative for - bleeding problems, blood clots or bruising Respiratory ROS: no cough, shortness of breath, or wheezing Cardiovascular ROS: no chest  pain or dyspnea on exertion Gastrointestinal ROS: no abdominal pain, change in bowel habits, or black or bloody stools Genito-Urinary ROS: no dysuria, trouble voiding, or hematuria  EXAM: There were no vitals filed for this visit.  General appearance: alert and cooperative Resp: clear to auscultation bilaterally Cardio: regular rate and rhythm GI: normal findings: soft, non-tender   Procedure: Anoscopy Surgeon: Marcello Moores Diagnosis: Rectal pain  Assistant: Morris After the risks and benefits were explained, verbal consent was obtained for above procedure  Anesthesia: none Findings: prolapsed internal hemorrhoids, grade 2.  Moderate external hemorrhoids     ASSESSMENT AND PLAN: NURA Gilmore is a 58 y.o. F with occosional constipation.  She has been treating this herself using Preparation H and sitz baths for her hemorrhoid disease and increasing her dietary fiber for her constipation. She states that she  strains on a daily basis to have bowel movements. On exam she has grade 2 internal hemorrhoids at least. I suspect that this most recent episode was a prolapsed internal hemorrhoid- given her exam findings. It most certainly could have been a rectal prolapse, but I do not see any evidence of this on her exam so far. Either way, initially these are treated similarly. I will start her on a fiber supplement to help add some bulkiness to her stools. If this does not decrease her straining, she will add MiraLax. We talked about toileting habits as well. She will try to consciously relax when having bowel movements instead of strain. I think if we get her to bulky, soft stools and she is still having trouble with having BM's without straining, we should probably start some testing with anorectal manometry and MR Defecography to identify any pelvic floor issues. I will see her back in about 2 months to see how she is progressing.      Rosario Adie, MD Colon and Rectal Surgery / [facility]      Visit Diagnoses: 1. Internal and external bleeding hemorrhoids     Primary Care Physician: Unice Cobble, MD

## 2013-09-23 ENCOUNTER — Ambulatory Visit (INDEPENDENT_AMBULATORY_CARE_PROVIDER_SITE_OTHER): Payer: BC Managed Care – PPO | Admitting: General Surgery

## 2013-09-23 ENCOUNTER — Encounter (INDEPENDENT_AMBULATORY_CARE_PROVIDER_SITE_OTHER): Payer: Self-pay | Admitting: General Surgery

## 2013-09-23 VITALS — BP 124/72 | HR 64 | Temp 98.0°F | Resp 18 | Ht 64.5 in | Wt 129.0 lb

## 2013-09-23 DIAGNOSIS — K648 Other hemorrhoids: Secondary | ICD-10-CM

## 2013-09-23 NOTE — Progress Notes (Signed)
Karen Gilmore is a 58 y.o. female who is here for a follow up visit regarding her Rectal pain. She states that she continues to have trouble with tissue prolapsing especially with bowel movements. She described this as little polyps that she has to push back inside. She occasionally has rectal bleeding.  Objective: Filed Vitals:   09/23/13 1736  BP: 124/72  Pulse: 64  Temp: 98 F (36.7 C)  Resp: 18    General appearance: alert and cooperative GI: normal findings: soft, non-tender  Procedure: Anoscopy and banding Surgeon: Marcello Moores Assistant: Holley Raring K After the risks and benefits were explained, verbal consent was obtained for above procedure  Anesthesia: none Diagnosis: bleeding, prolapsing grade 3 internal hemorrhoids  The anatomy & physiology of the anorectal region was discussed.  The pathophysiology of hemorrhoids and differential diagnosis was discussed.  Natural history progression  was discussed.   I stressed the importance of a bowel regimen to have daily soft bowel movements to minimize progression of disease.     The patient's symptoms are not adequately controlled.  Therefore, I recommended banding to treat the hemorrhoids.  I went over the technique, risks, benefits, and alternatives.   Goals of post-operative recovery were discussed as well.  Questions were answered.  The patient expressed understanding & wished to proceed.  The patient was positioned in the prone position on the proctology table.  Perianal & rectal examination was done.  Using anoscopy, I ligated theLL and RA hemorrhoids above the dentate line with banding.  The patient tolerated the procedure well.  Educational handouts further explaining the pathology, treatment options, and bowel regimen were given as well.    Assessment and Plan: Karen Gilmore Is a 58 year old female who appears to have some prolapsing Internal hemorrhoids.  We discussed that these may continue to give her problems and probably won't get  any better given she is on a very good fiber regimen. She continues to have Problems with prolapsing hemorrhoids and rectal pressure. She occasionally has bleeding as well. We discussed her options in detail. We have decided to do a banding in the office. She continues to have problems in the future she may be a candidate for [facility] or hemorrhoidectomy.  I will see her back in 4 weeks.    Rosario Adie, MD [facility], [place]

## 2013-09-23 NOTE — Patient Instructions (Signed)
Patient Information following hemorrhoid banding  Hemorrhoid banding is a procedure that places a small rubber band around a hemorrhoid, causing it to clot and then break off and be passed in the stool.  This is a minor procedure, but problems may develop in rare cases.  The following are warning symptoms and signs that should alert you to a possible complication.  Please contact the office if any of these should occur:   Increased pain with bowel movements or sitting  Temperature over 100.4 F (oral)  Bleeding that is excessive (over one cup of clots or blood)  Redness or irritation outside the anus  Difficulty urinating  For your comfort please follow these instructions:   Maintain a high fiber diet so that your bowel movements will be soft  Take a fiber supplement twice a day (such as Metamucil, Benefiber, Citracel or Fibercon)  Sit in a tub of warm water 2-3 times a day for the first 2-3 days, as needed to soothe the area.  You may expect some pressure sensations in the anal area for 1-2 days  If you need pain medication, take Tylenol not aspirin or Ibuprofen products.  In 7-10 days, the banded tissue and rubber band will pass with your stool.  Occasionally there is some bleeding after this.  If this bleeding seems excessive, call the office immediately or go to the Emergency Room.    Make an appointment to see me in 2-3 weeks after the procedure 

## 2013-10-22 ENCOUNTER — Encounter (INDEPENDENT_AMBULATORY_CARE_PROVIDER_SITE_OTHER): Payer: Self-pay | Admitting: General Surgery

## 2013-10-22 ENCOUNTER — Encounter (INDEPENDENT_AMBULATORY_CARE_PROVIDER_SITE_OTHER): Payer: BC Managed Care – PPO | Admitting: General Surgery

## 2013-10-22 ENCOUNTER — Ambulatory Visit (INDEPENDENT_AMBULATORY_CARE_PROVIDER_SITE_OTHER): Payer: BC Managed Care – PPO | Admitting: General Surgery

## 2013-10-22 VITALS — BP 132/82 | HR 74 | Temp 97.3°F | Resp 14 | Ht 64.5 in | Wt 127.2 lb

## 2013-10-22 DIAGNOSIS — K648 Other hemorrhoids: Secondary | ICD-10-CM | POA: Insufficient documentation

## 2013-10-22 NOTE — Progress Notes (Signed)
Karen Gilmore is a 58 y.o. female who is here for a follow up visit regarding her prolapsing hemorrhoids.  Patient reports her symptoms are much better. They're not completely resolved.  Objective: Filed Vitals:   10/22/13 1235  BP: 132/82  Pulse: 74  Temp: 97.3 F (36.3 C)  Resp: 14    General appearance: alert and cooperative GI: normal findings: soft, non-tender anal exam reveals healing areas where rubber band used to be.   Assessment and Plan: The patient still seems to be improving after banding 4 weeks ago. I will see her in the office again in approximately 3-4 weeks. If her symptoms are not completely resolved by then we will repeat the banding. The patient will continue her fiber regimen until then.    Rosario Adie, MD [facility], [place]

## 2013-10-22 NOTE — Patient Instructions (Signed)
Return to the office in 3-4 for possible repeat banding.

## 2013-11-14 ENCOUNTER — Encounter (INDEPENDENT_AMBULATORY_CARE_PROVIDER_SITE_OTHER): Payer: BC Managed Care – PPO | Admitting: General Surgery

## 2013-12-13 ENCOUNTER — Encounter (INDEPENDENT_AMBULATORY_CARE_PROVIDER_SITE_OTHER): Payer: BC Managed Care – PPO | Admitting: General Surgery

## 2013-12-20 ENCOUNTER — Ambulatory Visit (INDEPENDENT_AMBULATORY_CARE_PROVIDER_SITE_OTHER): Payer: BC Managed Care – PPO | Admitting: General Surgery

## 2013-12-20 ENCOUNTER — Telehealth (INDEPENDENT_AMBULATORY_CARE_PROVIDER_SITE_OTHER): Payer: Self-pay | Admitting: General Surgery

## 2013-12-20 ENCOUNTER — Encounter (INDEPENDENT_AMBULATORY_CARE_PROVIDER_SITE_OTHER): Payer: Self-pay | Admitting: General Surgery

## 2013-12-20 VITALS — BP 116/82 | HR 77 | Temp 98.5°F | Ht 62.0 in | Wt 129.0 lb

## 2013-12-20 DIAGNOSIS — K648 Other hemorrhoids: Secondary | ICD-10-CM

## 2013-12-20 DIAGNOSIS — K642 Third degree hemorrhoids: Secondary | ICD-10-CM

## 2013-12-20 NOTE — Progress Notes (Signed)
Chief Complaint  Patient presents with  . Post-op Problem    HISTORY:  Karen Gilmore is a 58 y.o. female who presents to the office with recurrent prolapsing internal hemorrhoids.  She states that all other symptoms have resolved after starting a high fiber diet and banding, but the prolapse remains.  She has to manually reduce these after BM's.  Past Medical History  Diagnosis Date  . 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 Rectal bleeding   . Constipation   . Rectal pain        Past Surgical History  Procedure Laterality Date  . G 3 p 2    . Tubal ligation      Dr Alden Hipp  . Colonoscopy  2006    hemorrhoids;[facility]      Current Outpatient Prescriptions  Medication Sig Dispense Refill  . Probiotic Product (ALIGN PO) Take by mouth daily.       No current facility-administered medications for this visit.     No Known Allergies    Family History  Problem Relation Age of Onset  . Heart attack Father 47  . Heart disease Father     MI at age 8  . Heart attack Paternal Grandfather     early 2s (NOT definite)  . Breast cancer Mother   . Cancer Mother     breast  . Diabetes Neg Hx   . Hypertension Neg Hx   . Stroke Neg Hx       History   Social History  . Marital Status: Single    Spouse Name: N/A    Number of Children: N/A  . Years of Education: N/A   Social History Main Topics  . Smoking status: Never Smoker   . Smokeless tobacco: None  . Alcohol Use: 1.8 oz/week    3 Glasses of wine per week     Comment:  socially  . Drug Use: No  . Sexual Activity: None   Other Topics Concern  . None   Social History Narrative  . None       REVIEW OF SYSTEMS - PERTINENT POSITIVES ONLY: Review of Systems - General ROS: negative Respiratory ROS: no cough, shortness of breath, or wheezing Cardiovascular ROS: no chest pain or dyspnea on exertion Gastrointestinal ROS: no abdominal pain, change in bowel habits, or black or bloody  stools Genito-Urinary ROS: no dysuria, trouble voiding, or hematuria  EXAM: Filed Vitals:   12/20/13 1207  BP: 116/82  Pulse: 77  Temp: 98.5 F (36.9 C)    General appearance: alert and cooperative Resp: clear to auscultation bilaterally Cardio: regular rate and rhythm GI: soft, non-tender; bowel sounds normal; no masses,  no organomegaly    ASSESSMENT AND PLAN: Karen Gilmore is a 58 y.o. F with grade 3 prolapsing hemorrhoids.  We discussed that banding again would have ~50% chance of success and that hemorrhoidopexy would have a much higher success rate.  We discussed that if the patient had any symptoms after surgery, they should resolve in a week or two.  She understands the risks of surgery, which are mainly bleeding, pain and recurrence.  We will schedule this during her summer vacation from school.     Rosario Adie, MD Colon and Rectal Surgery / [facility]      Visit Diagnoses: No diagnosis found.  Primary Care Physician: Unice Cobble, MD

## 2013-12-20 NOTE — Telephone Encounter (Signed)
Patient met with surgery scheduling went over financial responsibilities, patient will call back to schedule. °

## 2013-12-24 ENCOUNTER — Telehealth (INDEPENDENT_AMBULATORY_CARE_PROVIDER_SITE_OTHER): Payer: Self-pay

## 2013-12-24 NOTE — Telephone Encounter (Signed)
Pt called in to state that she has opted out of having surgery. She just wanted to inform Dr. Marcello Moores.

## 2014-01-13 ENCOUNTER — Encounter: Payer: Self-pay | Admitting: Physician Assistant

## 2014-01-13 ENCOUNTER — Ambulatory Visit (INDEPENDENT_AMBULATORY_CARE_PROVIDER_SITE_OTHER): Payer: BC Managed Care – PPO | Admitting: Physician Assistant

## 2014-01-13 VITALS — BP 118/71 | HR 63 | Temp 98.0°F | Resp 14 | Ht 64.0 in | Wt 130.5 lb

## 2014-01-13 DIAGNOSIS — L989 Disorder of the skin and subcutaneous tissue, unspecified: Secondary | ICD-10-CM

## 2014-01-13 MED ORDER — MUPIROCIN 2 % EX OINT: TOPICAL_OINTMENT | CUTANEOUS | Status: DC

## 2014-01-13 NOTE — Progress Notes (Signed)
Pre visit review using our clinic review tool, if applicable. No additional management support is needed unless otherwise documented below in the visit note/SLS  

## 2014-01-13 NOTE — Patient Instructions (Signed)
Please apply cream to area TID for 1 week.  Avoid excess sun exposure.  Keep area moisturized.  Follow-up with Dermatologist as scheduled.

## 2014-01-14 DIAGNOSIS — L989 Disorder of the skin and subcutaneous tissue, unspecified: Secondary | ICD-10-CM | POA: Insufficient documentation

## 2014-01-14 NOTE — Assessment & Plan Note (Addendum)
Unclear etiology.  Infectious vs Malignancy.  Mupirocin topical ointment given to apply as directed.  Patient has appointment scheduled with dermatology for evaluation and biopsy.

## 2014-01-14 NOTE — Progress Notes (Signed)
Patient presents to clinic today c/o 1 cm erythematous lesion of her forehead that has been present for 3 weeks.  Patient denies change in size or nature of lesion.  Denies drainage from lesion.  Denies pain or pruritus.  Denies excess sun exposure.  Denies recent travel.  Denies new pet. Denies hx of skin cancer or other dermatological condition.   Past Medical History  Diagnosis Date  . 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 Rectal bleeding   . Constipation   . Rectal pain     Current Outpatient Prescriptions on File Prior to Visit  Medication Sig Dispense Refill  . Probiotic Product (ALIGN PO) Take by mouth daily.       No current facility-administered medications on file prior to visit.    No Known Allergies  Family History  Problem Relation Age of Onset  . Heart attack Father 66  . Heart disease Father     MI at age 83  . Heart attack Paternal Grandfather     early 5s (NOT definite)  . Breast cancer Mother   . Cancer Mother     breast  . Diabetes Neg Hx   . Hypertension Neg Hx   . Stroke Neg Hx     History   Social History  . Marital Status: Single    Spouse Name: N/A    Number of Children: N/A  . Years of Education: N/A   Social History Main Topics  . Smoking status: Never Smoker   . Smokeless tobacco: None  . Alcohol Use: 1.8 oz/week    3 Glasses of wine per week     Comment:  socially  . Drug Use: No  . Sexual Activity: None   Other Topics Concern  . None   Social History Narrative  . None   Review of Systems - See HPI.  All other ROS are negative.  BP 118/71  Pulse 63  Temp(Src) 98 F (36.7 C) (Oral)  Resp 14  Ht 5\' 4"  (1.626 m)  Wt 130 lb 8 oz (59.194 kg)  BMI 22.39 kg/m2  SpO2 98%  Physical Exam  Vitals reviewed. Constitutional: She is oriented to person, place, and time and well-developed, well-nourished, and in no distress.  HENT:  Head: Normocephalic and atraumatic.  Cardiovascular: Normal rate, regular rhythm,  normal heart sounds and intact distal pulses.   Pulmonary/Chest: Effort normal and breath sounds normal. No respiratory distress. She has no wheezes. She has no rales. She exhibits no tenderness.  Neurological: She is alert and oriented to person, place, and time.  Skin: Skin is warm and dry.     Psychiatric: Affect normal.    No results found for this or any previous visit (from the past 2160 hour(s)).  Assessment/Plan: Skin lesion Unclear etiology.  Infectious vs Malignancy.  Mupirocin topical ointment given to apply as directed.  Patient has appointment scheduled with dermatology for evaluation and biopsy.

## 2014-06-28 LAB — HM MAMMOGRAPHY

## 2014-07-14 ENCOUNTER — Ambulatory Visit (INDEPENDENT_AMBULATORY_CARE_PROVIDER_SITE_OTHER): Payer: BC Managed Care – PPO | Admitting: Family Medicine

## 2014-07-14 ENCOUNTER — Encounter: Payer: Self-pay | Admitting: Family Medicine

## 2014-07-14 VITALS — BP 118/72 | HR 68 | Temp 98.1°F | Ht 64.0 in | Wt 138.8 lb

## 2014-07-14 DIAGNOSIS — Z23 Encounter for immunization: Secondary | ICD-10-CM

## 2014-07-14 DIAGNOSIS — Z0000 Encounter for general adult medical examination without abnormal findings: Secondary | ICD-10-CM

## 2014-07-14 DIAGNOSIS — E785 Hyperlipidemia, unspecified: Secondary | ICD-10-CM

## 2014-07-14 NOTE — Progress Notes (Signed)
Subjective:     Karen Gilmore is a 58 y.o. female and is here for a comprehensive physical exam. The patient reports no problems.  History   Social History  . Marital Status: Single    Spouse Name: N/A    Number of Children: N/A  . Years of Education: N/A   Occupational History  . Not on file.   Social History Main Topics  . Smoking status: Never Smoker   . Smokeless tobacco: Not on file  . Alcohol Use: 1.8 oz/week    3 Glasses of wine per week     Comment:  socially  . Drug Use: No  . Sexual Activity: Not on file   Other Topics Concern  . Not on file   Social History Narrative   Health Maintenance  Topic Date Due  . INFLUENZA VACCINE  02/15/2014  . COLONOSCOPY  01/06/2015  . MAMMOGRAM  07/07/2016  . PAP SMEAR  07/07/2017  . TETANUS/TDAP  06/13/2023    The following portions of the patient's history were reviewed and updated as appropriate:  She  has a past medical history of Mitral click-murmur syndrome; Rectal bleeding; Constipation; and Rectal pain. She  does not have any pertinent problems on file. She  has past surgical history that includes G 3 P 2; Tubal ligation; Colonoscopy (2006); and Dilation and curettage of uterus (1995). Her family history includes Breast cancer (age of onset: 34) in her mother; Heart attack in her paternal grandfather; Heart attack (age of onset: 52) in her father; Heart disease in her father; Hyperlipidemia in her father. There is no history of Diabetes, Hypertension, or Stroke. She  reports that she has never smoked. She does not have any smokeless tobacco history on file. She reports that she drinks about 1.8 oz of alcohol per week. She reports that she does not use illicit drugs. She has a current medication list which includes the following prescription(s): cholecalciferol, multivitamin, and probiotic product. Current Outpatient Prescriptions on File Prior to Visit  Medication Sig Dispense Refill  . Probiotic Product (ALIGN PO)  Take by mouth daily.     No current facility-administered medications on file prior to visit.   She has No Known Allergies..  Review of Systems Review of Systems  Constitutional: Negative for activity change, appetite change and fatigue.  HENT: Negative for hearing loss, congestion, tinnitus and ear discharge.  dentist q74m Eyes: Negative for visual disturbance (see optho q1y -- vision corrected to 20/20 with glasses).  Respiratory: Negative for cough, chest tightness and shortness of breath.   Cardiovascular: Negative for chest pain, palpitations and leg swelling.  Gastrointestinal: Negative for abdominal pain, diarrhea, constipation and abdominal distention.  Genitourinary: Negative for urgency, frequency, decreased urine volume and difficulty urinating.  Musculoskeletal: Negative for back pain, arthralgias and gait problem.  Skin: Negative for color change, pallor and rash.  Neurological: Negative for dizziness, light-headedness, numbness and headaches.  Hematological: Negative for adenopathy. Does not bruise/bleed easily.  Psychiatric/Behavioral: Negative for suicidal ideas, confusion, sleep disturbance, self-injury, dysphoric mood, decreased concentration and agitation.       Objective:    BP 118/72 mmHg  Pulse 68  Temp(Src) 98.1 F (36.7 C) (Oral)  Ht 5\' 4"  (1.626 m)  Wt 138 lb 12.8 oz (62.959 kg)  BMI 23.81 kg/m2  SpO2 97% General appearance: alert, cooperative, appears stated age and no distress Head: Normocephalic, without obvious abnormality, atraumatic Eyes: conjunctivae/corneas clear. PERRL, EOM's intact. Fundi benign. Ears: normal TM's and external ear canals  both ears Nose: Nares normal. Septum midline. Mucosa normal. No drainage or sinus tenderness. Throat: lips, mucosa, and tongue normal; teeth and gums normal Neck: no adenopathy, no carotid bruit, no JVD, supple, symmetrical, trachea midline and thyroid not enlarged, symmetric, no  tenderness/mass/nodules Back: symmetric, no curvature. ROM normal. No CVA tenderness. Lungs: clear to auscultation bilaterally Breasts: gyn Heart: S1, S2 normal Abdomen: soft, non-tender; bowel sounds normal; no masses,  no organomegaly Pelvic: deferred Extremities: extremities normal, atraumatic, no cyanosis or edema Pulses: 2+ and symmetric Skin: Skin color, texture, turgor normal. No rashes or lesions Lymph nodes: Cervical, supraclavicular, and axillary nodes normal. Neurologic: Alert and oriented X 3, normal strength and tone. Normal symmetric reflexes. Normal coordination and gait Psych--no depression, no anxiety      Assessment:    Healthy female exam.      Plan:    ghm utd Check labs See After Visit Summary for Counseling Recommendations    1. Hyperlipidemia Check labs - Basic metabolic panel; Future - CBC with Differential; Future - Hepatic function panel; Future - Lipid panel; Future - POCT urinalysis dipstick; Future - TSH; Future  2. Preventative health care  - Basic metabolic panel; Future - CBC with Differential; Future - Hepatic function panel; Future - Lipid panel; Future - POCT urinalysis dipstick; Future - TSH; Future

## 2014-07-14 NOTE — Progress Notes (Signed)
Pre visit review using our clinic review tool, if applicable. No additional management support is needed unless otherwise documented below in the visit note. 

## 2014-07-14 NOTE — Patient Instructions (Signed)
Preventive Care for Adults A healthy lifestyle and preventive care can promote health and wellness. Preventive health guidelines for women include the following key practices.  A routine yearly physical is a good way to check with your health care provider about your health and preventive screening. It is a chance to share any concerns and updates on your health and to receive a thorough exam.  Visit your dentist for a routine exam and preventive care every 6 months. Brush your teeth twice a day and floss once a day. Good oral hygiene prevents tooth decay and gum disease.  The frequency of eye exams is based on your age, health, family medical history, use of contact lenses, and other factors. Follow your health care provider's recommendations for frequency of eye exams.  Eat a healthy diet. Foods like vegetables, fruits, whole grains, low-fat dairy products, and lean protein foods contain the nutrients you need without too many calories. Decrease your intake of foods high in solid fats, added sugars, and salt. Eat the right amount of calories for you.Get information about a proper diet from your health care provider, if necessary.  Regular physical exercise is one of the most important things you can do for your health. Most adults should get at least 150 minutes of moderate-intensity exercise (any activity that increases your heart rate and causes you to sweat) each week. In addition, most adults need muscle-strengthening exercises on 2 or more days a week.  Maintain a healthy weight. The body mass index (BMI) is a screening tool to identify possible weight problems. It provides an estimate of body fat based on height and weight. Your health care provider can find your BMI and can help you achieve or maintain a healthy weight.For adults 20 years and older:  A BMI below 18.5 is considered underweight.  A BMI of 18.5 to 24.9 is normal.  A BMI of 25 to 29.9 is considered overweight.  A BMI of  30 and above is considered obese.  Maintain normal blood lipids and cholesterol levels by exercising and minimizing your intake of saturated fat. Eat a balanced diet with plenty of fruit and vegetables. Blood tests for lipids and cholesterol should begin at age 76 and be repeated every 5 years. If your lipid or cholesterol levels are high, you are over 50, or you are at high risk for heart disease, you may need your cholesterol levels checked more frequently.Ongoing high lipid and cholesterol levels should be treated with medicines if diet and exercise are not working.  If you smoke, find out from your health care provider how to quit. If you do not use tobacco, do not start.  Lung cancer screening is recommended for adults aged 22-80 years who are at high risk for developing lung cancer because of a history of smoking. A yearly low-dose CT scan of the lungs is recommended for people who have at least a 30-pack-year history of smoking and are a current smoker or have quit within the past 15 years. A pack year of smoking is smoking an average of 1 pack of cigarettes a day for 1 year (for example: 1 pack a day for 30 years or 2 packs a day for 15 years). Yearly screening should continue until the smoker has stopped smoking for at least 15 years. Yearly screening should be stopped for people who develop a health problem that would prevent them from having lung cancer treatment.  If you are pregnant, do not drink alcohol. If you are breastfeeding,  be very cautious about drinking alcohol. If you are not pregnant and choose to drink alcohol, do not have more than 1 drink per day. One drink is considered to be 12 ounces (355 mL) of beer, 5 ounces (148 mL) of wine, or 1.5 ounces (44 mL) of liquor.  Avoid use of street drugs. Do not share needles with anyone. Ask for help if you need support or instructions about stopping the use of drugs.  High blood pressure causes heart disease and increases the risk of  stroke. Your blood pressure should be checked at least every 1 to 2 years. Ongoing high blood pressure should be treated with medicines if weight loss and exercise do not work.  If you are 3-86 years old, ask your health care provider if you should take aspirin to prevent strokes.  Diabetes screening involves taking a blood sample to check your fasting blood sugar level. This should be done once every 3 years, after age 67, if you are within normal weight and without risk factors for diabetes. Testing should be considered at a younger age or be carried out more frequently if you are overweight and have at least 1 risk factor for diabetes.  Breast cancer screening is essential preventive care for women. You should practice "breast self-awareness." This means understanding the normal appearance and feel of your breasts and may include breast self-examination. Any changes detected, no matter how small, should be reported to a health care provider. Women in their 8s and 30s should have a clinical breast exam (CBE) by a health care provider as part of a regular health exam every 1 to 3 years. After age 70, women should have a CBE every year. Starting at age 25, women should consider having a mammogram (breast X-ray test) every year. Women who have a family history of breast cancer should talk to their health care provider about genetic screening. Women at a high risk of breast cancer should talk to their health care providers about having an MRI and a mammogram every year.  Breast cancer gene (BRCA)-related cancer risk assessment is recommended for women who have family members with BRCA-related cancers. BRCA-related cancers include breast, ovarian, tubal, and peritoneal cancers. Having family members with these cancers may be associated with an increased risk for harmful changes (mutations) in the breast cancer genes BRCA1 and BRCA2. Results of the assessment will determine the need for genetic counseling and  BRCA1 and BRCA2 testing.  Routine pelvic exams to screen for cancer are no longer recommended for nonpregnant women who are considered low risk for cancer of the pelvic organs (ovaries, uterus, and vagina) and who do not have symptoms. Ask your health care provider if a screening pelvic exam is right for you.  If you have had past treatment for cervical cancer or a condition that could lead to cancer, you need Pap tests and screening for cancer for at least 20 years after your treatment. If Pap tests have been discontinued, your risk factors (such as having a new sexual partner) need to be reassessed to determine if screening should be resumed. Some women have medical problems that increase the chance of getting cervical cancer. In these cases, your health care provider may recommend more frequent screening and Pap tests.  The HPV test is an additional test that may be used for cervical cancer screening. The HPV test looks for the virus that can cause the cell changes on the cervix. The cells collected during the Pap test can be  tested for HPV. The HPV test could be used to screen women aged 30 years and older, and should be used in women of any age who have unclear Pap test results. After the age of 30, women should have HPV testing at the same frequency as a Pap test.  Colorectal cancer can be detected and often prevented. Most routine colorectal cancer screening begins at the age of 50 years and continues through age 75 years. However, your health care provider may recommend screening at an earlier age if you have risk factors for colon cancer. On a yearly basis, your health care provider may provide home test kits to check for hidden blood in the stool. Use of a small camera at the end of a tube, to directly examine the colon (sigmoidoscopy or colonoscopy), can detect the earliest forms of colorectal cancer. Talk to your health care provider about this at age 50, when routine screening begins. Direct  exam of the colon should be repeated every 5-10 years through age 75 years, unless early forms of pre-cancerous polyps or small growths are found.  People who are at an increased risk for hepatitis B should be screened for this virus. You are considered at high risk for hepatitis B if:  You were born in a country where hepatitis B occurs often. Talk with your health care provider about which countries are considered high risk.  Your parents were born in a high-risk country and you have not received a shot to protect against hepatitis B (hepatitis B vaccine).  You have HIV or AIDS.  You use needles to inject street drugs.  You live with, or have sex with, someone who has hepatitis B.  You get hemodialysis treatment.  You take certain medicines for conditions like cancer, organ transplantation, and autoimmune conditions.  Hepatitis C blood testing is recommended for all people born from 1945 through 1965 and any individual with known risks for hepatitis C.  Practice safe sex. Use condoms and avoid high-risk sexual practices to reduce the spread of sexually transmitted infections (STIs). STIs include gonorrhea, chlamydia, syphilis, trichomonas, herpes, HPV, and human immunodeficiency virus (HIV). Herpes, HIV, and HPV are viral illnesses that have no cure. They can result in disability, cancer, and death.  You should be screened for sexually transmitted illnesses (STIs) including gonorrhea and chlamydia if:  You are sexually active and are younger than 24 years.  You are older than 24 years and your health care provider tells you that you are at risk for this type of infection.  Your sexual activity has changed since you were last screened and you are at an increased risk for chlamydia or gonorrhea. Ask your health care provider if you are at risk.  If you are at risk of being infected with HIV, it is recommended that you take a prescription medicine daily to prevent HIV infection. This is  called preexposure prophylaxis (PrEP). You are considered at risk if:  You are a heterosexual woman, are sexually active, and are at increased risk for HIV infection.  You take drugs by injection.  You are sexually active with a partner who has HIV.  Talk with your health care provider about whether you are at high risk of being infected with HIV. If you choose to begin PrEP, you should first be tested for HIV. You should then be tested every 3 months for as long as you are taking PrEP.  Osteoporosis is a disease in which the bones lose minerals and strength   with aging. This can result in serious bone fractures or breaks. The risk of osteoporosis can be identified using a bone density scan. Women ages 65 years and over and women at risk for fractures or osteoporosis should discuss screening with their health care providers. Ask your health care provider whether you should take a calcium supplement or vitamin D to reduce the rate of osteoporosis.  Menopause can be associated with physical symptoms and risks. Hormone replacement therapy is available to decrease symptoms and risks. You should talk to your health care provider about whether hormone replacement therapy is right for you.  Use sunscreen. Apply sunscreen liberally and repeatedly throughout the day. You should seek shade when your shadow is shorter than you. Protect yourself by wearing long sleeves, pants, a wide-brimmed hat, and sunglasses year round, whenever you are outdoors.  Once a month, do a whole body skin exam, using a mirror to look at the skin on your back. Tell your health care provider of new moles, moles that have irregular borders, moles that are larger than a pencil eraser, or moles that have changed in shape or color.  Stay current with required vaccines (immunizations).  Influenza vaccine. All adults should be immunized every year.  Tetanus, diphtheria, and acellular pertussis (Td, Tdap) vaccine. Pregnant women should  receive 1 dose of Tdap vaccine during each pregnancy. The dose should be obtained regardless of the length of time since the last dose. Immunization is preferred during the 27th-36th week of gestation. An adult who has not previously received Tdap or who does not know her vaccine status should receive 1 dose of Tdap. This initial dose should be followed by tetanus and diphtheria toxoids (Td) booster doses every 10 years. Adults with an unknown or incomplete history of completing a 3-dose immunization series with Td-containing vaccines should begin or complete a primary immunization series including a Tdap dose. Adults should receive a Td booster every 10 years.  Varicella vaccine. An adult without evidence of immunity to varicella should receive 2 doses or a second dose if she has previously received 1 dose. Pregnant females who do not have evidence of immunity should receive the first dose after pregnancy. This first dose should be obtained before leaving the health care facility. The second dose should be obtained 4-8 weeks after the first dose.  Human papillomavirus (HPV) vaccine. Females aged 13-26 years who have not received the vaccine previously should obtain the 3-dose series. The vaccine is not recommended for use in pregnant females. However, pregnancy testing is not needed before receiving a dose. If a female is found to be pregnant after receiving a dose, no treatment is needed. In that case, the remaining doses should be delayed until after the pregnancy. Immunization is recommended for any person with an immunocompromised condition through the age of 26 years if she did not get any or all doses earlier. During the 3-dose series, the second dose should be obtained 4-8 weeks after the first dose. The third dose should be obtained 24 weeks after the first dose and 16 weeks after the second dose.  Zoster vaccine. One dose is recommended for adults aged 60 years or older unless certain conditions are  present.  Measles, mumps, and rubella (MMR) vaccine. Adults born before 1957 generally are considered immune to measles and mumps. Adults born in 1957 or later should have 1 or more doses of MMR vaccine unless there is a contraindication to the vaccine or there is laboratory evidence of immunity to   each of the three diseases. A routine second dose of MMR vaccine should be obtained at least 28 days after the first dose for students attending postsecondary schools, health care workers, or international travelers. People who received inactivated measles vaccine or an unknown type of measles vaccine during 1963-1967 should receive 2 doses of MMR vaccine. People who received inactivated mumps vaccine or an unknown type of mumps vaccine before 1979 and are at high risk for mumps infection should consider immunization with 2 doses of MMR vaccine. For females of childbearing age, rubella immunity should be determined. If there is no evidence of immunity, females who are not pregnant should be vaccinated. If there is no evidence of immunity, females who are pregnant should delay immunization until after pregnancy. Unvaccinated health care workers born before 1957 who lack laboratory evidence of measles, mumps, or rubella immunity or laboratory confirmation of disease should consider measles and mumps immunization with 2 doses of MMR vaccine or rubella immunization with 1 dose of MMR vaccine.  Pneumococcal 13-valent conjugate (PCV13) vaccine. When indicated, a person who is uncertain of her immunization history and has no record of immunization should receive the PCV13 vaccine. An adult aged 19 years or older who has certain medical conditions and has not been previously immunized should receive 1 dose of PCV13 vaccine. This PCV13 should be followed with a dose of pneumococcal polysaccharide (PPSV23) vaccine. The PPSV23 vaccine dose should be obtained at least 8 weeks after the dose of PCV13 vaccine. An adult aged 19  years or older who has certain medical conditions and previously received 1 or more doses of PPSV23 vaccine should receive 1 dose of PCV13. The PCV13 vaccine dose should be obtained 1 or more years after the last PPSV23 vaccine dose.  Pneumococcal polysaccharide (PPSV23) vaccine. When PCV13 is also indicated, PCV13 should be obtained first. All adults aged 65 years and older should be immunized. An adult younger than age 65 years who has certain medical conditions should be immunized. Any person who resides in a nursing home or long-term care facility should be immunized. An adult smoker should be immunized. People with an immunocompromised condition and certain other conditions should receive both PCV13 and PPSV23 vaccines. People with human immunodeficiency virus (HIV) infection should be immunized as soon as possible after diagnosis. Immunization during chemotherapy or radiation therapy should be avoided. Routine use of PPSV23 vaccine is not recommended for American Indians, [place] Natives, or people younger than 65 years unless there are medical conditions that require PPSV23 vaccine. When indicated, people who have unknown immunization and have no record of immunization should receive PPSV23 vaccine. One-time revaccination 5 years after the first dose of PPSV23 is recommended for people aged 19-64 years who have chronic kidney failure, nephrotic syndrome, asplenia, or immunocompromised conditions. People who received 1-2 doses of PPSV23 before age 65 years should receive another dose of PPSV23 vaccine at age 65 years or later if at least 5 years have passed since the previous dose. Doses of PPSV23 are not needed for people immunized with PPSV23 at or after age 65 years.  Meningococcal vaccine. Adults with asplenia or persistent complement component deficiencies should receive 2 doses of quadrivalent meningococcal conjugate (MenACWY-D) vaccine. The doses should be obtained at least 2 months apart.  Microbiologists working with certain meningococcal bacteria, military recruits, people at risk during an outbreak, and people who travel to or live in countries with a high rate of meningitis should be immunized. A first-year college student up through age   21 years who is living in a residence hall should receive a dose if she did not receive a dose on or after her 16th birthday. Adults who have certain high-risk conditions should receive one or more doses of vaccine.  Hepatitis A vaccine. Adults who wish to be protected from this disease, have certain high-risk conditions, work with hepatitis A-infected animals, work in hepatitis A research labs, or travel to or work in countries with a high rate of hepatitis A should be immunized. Adults who were previously unvaccinated and who anticipate close contact with an international adoptee during the first 60 days after arrival in the [place] from a country with a high rate of hepatitis A should be immunized.  Hepatitis B vaccine. Adults who wish to be protected from this disease, have certain high-risk conditions, may be exposed to blood or other infectious body fluids, are household contacts or sex partners of hepatitis B positive people, are clients or workers in certain care facilities, or travel to or work in countries with a high rate of hepatitis B should be immunized.  Haemophilus influenzae type b (Hib) vaccine. A previously unvaccinated person with asplenia or sickle cell disease or having a scheduled splenectomy should receive 1 dose of Hib vaccine. Regardless of previous immunization, a recipient of a hematopoietic stem cell transplant should receive a 3-dose series 6-12 months after her successful transplant. Hib vaccine is not recommended for adults with HIV infection. Preventive Services / Frequency Ages 64 to 68 years  Blood pressure check.** / Every 1 to 2 years.  Lipid and cholesterol check.** / Every 5 years beginning at age  22.  Clinical breast exam.** / Every 3 years for women in their 88s and 53s.  BRCA-related cancer risk assessment.** / For women who have family members with a BRCA-related cancer (breast, ovarian, tubal, or peritoneal cancers).  Pap test.** / Every 2 years from ages 90 through 51. Every 3 years starting at age 21 through age 56 or 3 with a history of 3 consecutive normal Pap tests.  HPV screening.** / Every 3 years from ages 24 through ages 1 to 46 with a history of 3 consecutive normal Pap tests.  Hepatitis C blood test.** / For any individual with known risks for hepatitis C.  Skin self-exam. / Monthly.  Influenza vaccine. / Every year.  Tetanus, diphtheria, and acellular pertussis (Tdap, Td) vaccine.** / Consult your health care provider. Pregnant women should receive 1 dose of Tdap vaccine during each pregnancy. 1 dose of Td every 10 years.  Varicella vaccine.** / Consult your health care provider. Pregnant females who do not have evidence of immunity should receive the first dose after pregnancy.  HPV vaccine. / 3 doses over 6 months, if 72 and younger. The vaccine is not recommended for use in pregnant females. However, pregnancy testing is not needed before receiving a dose.  Measles, mumps, rubella (MMR) vaccine.** / You need at least 1 dose of MMR if you were born in 1957 or later. You may also need a 2nd dose. For females of childbearing age, rubella immunity should be determined. If there is no evidence of immunity, females who are not pregnant should be vaccinated. If there is no evidence of immunity, females who are pregnant should delay immunization until after pregnancy.  Pneumococcal 13-valent conjugate (PCV13) vaccine.** / Consult your health care provider.  Pneumococcal polysaccharide (PPSV23) vaccine.** / 1 to 2 doses if you smoke cigarettes or if you have certain conditions.  Meningococcal vaccine.** /  1 dose if you are age 19 to 21 years and a first-year college  student living in a residence hall, or have one of several medical conditions, you need to get vaccinated against meningococcal disease. You may also need additional booster doses.  Hepatitis A vaccine.** / Consult your health care provider.  Hepatitis B vaccine.** / Consult your health care provider.  Haemophilus influenzae type b (Hib) vaccine.** / Consult your health care provider. Ages 40 to 64 years  Blood pressure check.** / Every 1 to 2 years.  Lipid and cholesterol check.** / Every 5 years beginning at age 20 years.  Lung cancer screening. / Every year if you are aged 55-80 years and have a 30-pack-year history of smoking and currently smoke or have quit within the past 15 years. Yearly screening is stopped once you have quit smoking for at least 15 years or develop a health problem that would prevent you from having lung cancer treatment.  Clinical breast exam.** / Every year after age 40 years.  BRCA-related cancer risk assessment.** / For women who have family members with a BRCA-related cancer (breast, ovarian, tubal, or peritoneal cancers).  Mammogram.** / Every year beginning at age 40 years and continuing for as long as you are in good health. Consult with your health care provider.  Pap test.** / Every 3 years starting at age 30 years through age 65 or 70 years with a history of 3 consecutive normal Pap tests.  HPV screening.** / Every 3 years from ages 30 years through ages 65 to 70 years with a history of 3 consecutive normal Pap tests.  Fecal occult blood test (FOBT) of stool. / Every year beginning at age 50 years and continuing until age 75 years. You may not need to do this test if you get a colonoscopy every 10 years.  Flexible sigmoidoscopy or colonoscopy.** / Every 5 years for a flexible sigmoidoscopy or every 10 years for a colonoscopy beginning at age 50 years and continuing until age 75 years.  Hepatitis C blood test.** / For all people born from 1945 through  1965 and any individual with known risks for hepatitis C.  Skin self-exam. / Monthly.  Influenza vaccine. / Every year.  Tetanus, diphtheria, and acellular pertussis (Tdap/Td) vaccine.** / Consult your health care provider. Pregnant women should receive 1 dose of Tdap vaccine during each pregnancy. 1 dose of Td every 10 years.  Varicella vaccine.** / Consult your health care provider. Pregnant females who do not have evidence of immunity should receive the first dose after pregnancy.  Zoster vaccine.** / 1 dose for adults aged 60 years or older.  Measles, mumps, rubella (MMR) vaccine.** / You need at least 1 dose of MMR if you were born in 1957 or later. You may also need a 2nd dose. For females of childbearing age, rubella immunity should be determined. If there is no evidence of immunity, females who are not pregnant should be vaccinated. If there is no evidence of immunity, females who are pregnant should delay immunization until after pregnancy.  Pneumococcal 13-valent conjugate (PCV13) vaccine.** / Consult your health care provider.  Pneumococcal polysaccharide (PPSV23) vaccine.** / 1 to 2 doses if you smoke cigarettes or if you have certain conditions.  Meningococcal vaccine.** / Consult your health care provider.  Hepatitis A vaccine.** / Consult your health care provider.  Hepatitis B vaccine.** / Consult your health care provider.  Haemophilus influenzae type b (Hib) vaccine.** / Consult your health care provider. Ages 65   years and over  Blood pressure check.** / Every 1 to 2 years.  Lipid and cholesterol check.** / Every 5 years beginning at age 22 years.  Lung cancer screening. / Every year if you are aged 73-80 years and have a 30-pack-year history of smoking and currently smoke or have quit within the past 15 years. Yearly screening is stopped once you have quit smoking for at least 15 years or develop a health problem that would prevent you from having lung cancer  treatment.  Clinical breast exam.** / Every year after age 4 years.  BRCA-related cancer risk assessment.** / For women who have family members with a BRCA-related cancer (breast, ovarian, tubal, or peritoneal cancers).  Mammogram.** / Every year beginning at age 40 years and continuing for as long as you are in good health. Consult with your health care provider.  Pap test.** / Every 3 years starting at age 9 years through age 34 or 91 years with 3 consecutive normal Pap tests. Testing can be stopped between 65 and 70 years with 3 consecutive normal Pap tests and no abnormal Pap or HPV tests in the past 10 years.  HPV screening.** / Every 3 years from ages 57 years through ages 64 or 45 years with a history of 3 consecutive normal Pap tests. Testing can be stopped between 65 and 70 years with 3 consecutive normal Pap tests and no abnormal Pap or HPV tests in the past 10 years.  Fecal occult blood test (FOBT) of stool. / Every year beginning at age 15 years and continuing until age 17 years. You may not need to do this test if you get a colonoscopy every 10 years.  Flexible sigmoidoscopy or colonoscopy.** / Every 5 years for a flexible sigmoidoscopy or every 10 years for a colonoscopy beginning at age 86 years and continuing until age 71 years.  Hepatitis C blood test.** / For all people born from [birth year] through 1963 and any individual with known risks for hepatitis C.  Osteoporosis screening.** / A one-time screening for women ages 83 years and over and women at risk for fractures or osteoporosis.  Skin self-exam. / Monthly.  Influenza vaccine. / Every year.  Tetanus, diphtheria, and acellular pertussis (Tdap/Td) vaccine.** / 1 dose of Td every 10 years.  Varicella vaccine.** / Consult your health care provider.  Zoster vaccine.** / 1 dose for adults aged 61 years or older.  Pneumococcal 13-valent conjugate (PCV13) vaccine.** / Consult your health care provider.  Pneumococcal  polysaccharide (PPSV23) vaccine.** / 1 dose for all adults aged 28 years and older.  Meningococcal vaccine.** / Consult your health care provider.  Hepatitis A vaccine.** / Consult your health care provider.  Hepatitis B vaccine.** / Consult your health care provider.  Haemophilus influenzae type b (Hib) vaccine.** / Consult your health care provider. ** Family history and personal history of risk and conditions may change your health care provider's recommendations. Document Released: 08/30/2001 Document Revised: 11/18/2013 Document Reviewed: 11/29/2010 [facility] Patient Information 2015 [place], [place]. This information is not intended to replace advice given to you by your health care provider. Make sure you discuss any questions you have with your health care provider.

## 2014-07-15 ENCOUNTER — Other Ambulatory Visit (INDEPENDENT_AMBULATORY_CARE_PROVIDER_SITE_OTHER): Payer: BC Managed Care – PPO

## 2014-07-15 DIAGNOSIS — E785 Hyperlipidemia, unspecified: Secondary | ICD-10-CM

## 2014-07-15 DIAGNOSIS — Z0000 Encounter for general adult medical examination without abnormal findings: Secondary | ICD-10-CM

## 2014-07-15 LAB — BASIC METABOLIC PANEL
BUN: 13 mg/dL (ref 6–23)
CHLORIDE: 104 meq/L (ref 96–112)
CO2: 30 mEq/L (ref 19–32)
Calcium: 9.4 mg/dL (ref 8.4–10.5)
Creatinine, Ser: 0.7 mg/dL (ref 0.4–1.2)
GFR: 93.26 mL/min (ref >60.00)
Glucose, Bld: 87 mg/dL (ref 70–99)
POTASSIUM: 4 meq/L (ref 3.5–5.1)
SODIUM: 140 meq/L (ref 135–145)

## 2014-07-15 LAB — CBC WITH DIFFERENTIAL/PLATELET
BASOS ABS: 0.1 10*3/uL (ref 0.0–0.1)
Basophils Relative: 1.1 % (ref 0.0–3.0)
EOS ABS: 0.3 10*3/uL (ref 0.0–0.7)
Eosinophils Relative: 5.1 % — ABNORMAL HIGH (ref 0.0–5.0)
HEMATOCRIT: 41.6 % (ref 36.0–46.0)
HEMOGLOBIN: 13.7 g/dL (ref 12.0–15.0)
LYMPHS ABS: 2.3 10*3/uL (ref 0.7–4.0)
Lymphocytes Relative: 34.3 % (ref 12.0–46.0)
MCHC: 33 g/dL (ref 30.0–36.0)
MCV: 95.8 fl (ref 78.0–100.0)
MONO ABS: 0.5 10*3/uL (ref 0.1–1.0)
Monocytes Relative: 7.4 % (ref 3.0–12.0)
NEUTROS ABS: 3.5 10*3/uL (ref 1.4–7.7)
Neutrophils Relative %: 52.1 % (ref 43.0–77.0)
Platelets: 242 10*3/uL (ref 150.0–400.0)
RBC: 4.34 Mil/uL (ref 3.87–5.11)
RDW: 13.8 % (ref 11.5–15.5)
WBC: 6.7 10*3/uL (ref 4.0–10.5)

## 2014-07-15 LAB — LIPID PANEL
Cholesterol: 234 mg/dL — ABNORMAL HIGH (ref 0–200)
HDL: 68.2 mg/dL (ref >39.00)
LDL CALC: 147 mg/dL — AB (ref 0–99)
NONHDL: 165.8
Total CHOL/HDL Ratio: 3
Triglycerides: 93 mg/dL (ref 0.0–149.0)
VLDL: 18.6 mg/dL (ref 0.0–40.0)

## 2014-07-15 LAB — HEPATIC FUNCTION PANEL
ALK PHOS: 78 U/L (ref 39–117)
ALT: 16 U/L (ref 0–35)
AST: 19 U/L (ref 0–37)
Albumin: 4.1 g/dL (ref 3.5–5.2)
BILIRUBIN DIRECT: 0 mg/dL (ref 0.0–0.3)
TOTAL PROTEIN: 6.9 g/dL (ref 6.0–8.3)
Total Bilirubin: 0.7 mg/dL (ref 0.2–1.2)

## 2014-07-15 LAB — TSH: TSH: 2.06 u[IU]/mL (ref 0.35–4.50)

## 2014-07-15 NOTE — Addendum Note (Signed)
Addended by: Ewing Schlein on: 07/15/2014 01:51 PM   Modules accepted: Orders

## 2015-07-14 ENCOUNTER — Encounter: Payer: Self-pay | Admitting: Family Medicine

## 2015-07-14 ENCOUNTER — Ambulatory Visit (INDEPENDENT_AMBULATORY_CARE_PROVIDER_SITE_OTHER): Payer: BC Managed Care – PPO | Admitting: Family Medicine

## 2015-07-14 VITALS — BP 120/88 | HR 68 | Temp 97.7°F | Resp 16 | Ht 64.0 in | Wt 130.1 lb

## 2015-07-14 DIAGNOSIS — H6981 Other specified disorders of Eustachian tube, right ear: Secondary | ICD-10-CM

## 2015-07-14 NOTE — Patient Instructions (Signed)

## 2015-07-14 NOTE — Progress Notes (Signed)
   Subjective:    Patient ID: Karen Gilmore, female    DOB: 1955-09-05, 59 y.o.   MRN: [id number]  HPI Acute visit Patient is seen with 2 week history of some right ear pressure. No pain. Noticed this first after flying. No acute hearing loss. She notices sensation of pressure when she leans over. No vertigo. Denies any fever or chills. No sore throat symptoms. No alleviating factors. She denies any associated nasal congestion.  Past Medical History  Diagnosis Date  . 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 Rectal bleeding   . Constipation   . Rectal pain    Past Surgical History  Procedure Laterality Date  . G 3 p 2    . Tubal ligation      Dr Alden Hipp  . Colonoscopy  2006    hemorrhoids;[facility]  . Dilation and curettage of uterus  1995    reports that she has never smoked. She does not have any smokeless tobacco history on file. She reports that she drinks about 1.8 oz of alcohol per week. She reports that she does not use illicit drugs. family history includes Breast cancer (age of onset: 65) in her mother; Heart attack in her paternal grandfather; Heart attack (age of onset: 29) in her father; Heart disease in her father; Hyperlipidemia in her father. There is no history of Diabetes, Hypertension, or Stroke. No Known Allergies    Review of Systems  Constitutional: Negative for fever and chills.  HENT: Negative for congestion, ear discharge, ear pain and hearing loss.        Objective:   Physical Exam  Constitutional: She appears well-developed and well-nourished.  HENT:  Right Ear: External ear normal.  Left Ear: External ear normal.  Mouth/Throat: Oropharynx is clear and moist.  Neck: Neck supple.  Cardiovascular: Normal rate and regular rhythm.   Pulmonary/Chest: Effort normal and breath sounds normal. No respiratory distress. She has no wheezes. She has no rales.  Lymphadenopathy:    She has no cervical adenopathy.            Assessment & Plan:  Right ear pressure. No signs of acute infection. No obvious effusion. Question barotitis media/ eustachian tube dysfunction. Normal exam. Reassurance. We explained limitations of medications in treating this. Give this some time and follow-up with primary if symptoms persist

## 2015-07-14 NOTE — Progress Notes (Signed)
Pre visit review using our clinic review tool, if applicable. No additional management support is needed unless otherwise documented below in the visit note. 

## 2015-07-16 ENCOUNTER — Encounter: Payer: BC Managed Care – PPO | Admitting: Family Medicine

## 2015-10-12 ENCOUNTER — Telehealth: Payer: Self-pay | Admitting: *Deleted

## 2015-10-12 NOTE — Telephone Encounter (Signed)
Unable to reach patient at time of pre-visit call. Left message for patient to return call when available.  

## 2015-10-13 ENCOUNTER — Encounter: Payer: Self-pay | Admitting: Family Medicine

## 2015-10-13 ENCOUNTER — Ambulatory Visit (INDEPENDENT_AMBULATORY_CARE_PROVIDER_SITE_OTHER): Payer: BC Managed Care – PPO | Admitting: Family Medicine

## 2015-10-13 VITALS — BP 136/66 | HR 76 | Temp 98.2°F | Ht 64.0 in | Wt 135.6 lb

## 2015-10-13 DIAGNOSIS — Z114 Encounter for screening for human immunodeficiency virus [HIV]: Secondary | ICD-10-CM | POA: Diagnosis not present

## 2015-10-13 DIAGNOSIS — Z1159 Encounter for screening for other viral diseases: Secondary | ICD-10-CM

## 2015-10-13 DIAGNOSIS — Z0000 Encounter for general adult medical examination without abnormal findings: Secondary | ICD-10-CM | POA: Diagnosis not present

## 2015-10-13 DIAGNOSIS — I10 Essential (primary) hypertension: Secondary | ICD-10-CM

## 2015-10-13 DIAGNOSIS — E785 Hyperlipidemia, unspecified: Secondary | ICD-10-CM | POA: Diagnosis not present

## 2015-10-13 NOTE — Patient Instructions (Signed)
Preventive Care for Adults, Female A healthy lifestyle and preventive care can promote health and wellness. Preventive health guidelines for women include the following key practices.  A routine yearly physical is a good way to check with your health care provider about your health and preventive screening. It is a chance to share any concerns and updates on your health and to receive a thorough exam.  Visit your dentist for a routine exam and preventive care every 6 months. Brush your teeth twice a day and floss once a day. Good oral hygiene prevents tooth decay and gum disease.  The frequency of eye exams is based on your age, health, family medical history, use of contact lenses, and other factors. Follow your health care provider's recommendations for frequency of eye exams.  Eat a healthy diet. Foods like vegetables, fruits, whole grains, low-fat dairy products, and lean protein foods contain the nutrients you need without too many calories. Decrease your intake of foods high in solid fats, added sugars, and salt. Eat the right amount of calories for you.Get information about a proper diet from your health care provider, if necessary.  Regular physical exercise is one of the most important things you can do for your health. Most adults should get at least 150 minutes of moderate-intensity exercise (any activity that increases your heart rate and causes you to sweat) each week. In addition, most adults need muscle-strengthening exercises on 2 or more days a week.  Maintain a healthy weight. The body mass index (BMI) is a screening tool to identify possible weight problems. It provides an estimate of body fat based on height and weight. Your health care provider can find your BMI and can help you achieve or maintain a healthy weight.For adults 20 years and older:  A BMI below 18.5 is considered underweight.  A BMI of 18.5 to 24.9 is normal.  A BMI of 25 to 29.9 is considered overweight.  A  BMI of 30 and above is considered obese.  Maintain normal blood lipids and cholesterol levels by exercising and minimizing your intake of saturated fat. Eat a balanced diet with plenty of fruit and vegetables. Blood tests for lipids and cholesterol should begin at age 45 and be repeated every 5 years. If your lipid or cholesterol levels are high, you are over 50, or you are at high risk for heart disease, you may need your cholesterol levels checked more frequently.Ongoing high lipid and cholesterol levels should be treated with medicines if diet and exercise are not working.  If you smoke, find out from your health care provider how to quit. If you do not use tobacco, do not start.  Lung cancer screening is recommended for adults aged 45-80 years who are at high risk for developing lung cancer because of a history of smoking. A yearly low-dose CT scan of the lungs is recommended for people who have at least a 30-pack-year history of smoking and are a current smoker or have quit within the past 15 years. A pack year of smoking is smoking an average of 1 pack of cigarettes a day for 1 year (for example: 1 pack a day for 30 years or 2 packs a day for 15 years). Yearly screening should continue until the smoker has stopped smoking for at least 15 years. Yearly screening should be stopped for people who develop a health problem that would prevent them from having lung cancer treatment.  If you are pregnant, do not drink alcohol. If you are  breastfeeding, be very cautious about drinking alcohol. If you are not pregnant and choose to drink alcohol, do not have more than 1 drink per day. One drink is considered to be 12 ounces (355 mL) of beer, 5 ounces (148 mL) of wine, or 1.5 ounces (44 mL) of liquor.  Avoid use of street drugs. Do not share needles with anyone. Ask for help if you need support or instructions about stopping the use of drugs.  High blood pressure causes heart disease and increases the risk  of stroke. Your blood pressure should be checked at least every 1 to 2 years. Ongoing high blood pressure should be treated with medicines if weight loss and exercise do not work.  If you are 55-79 years old, ask your health care provider if you should take aspirin to prevent strokes.  Diabetes screening is done by taking a blood sample to check your blood glucose level after you have not eaten for a certain period of time (fasting). If you are not overweight and you do not have risk factors for diabetes, you should be screened once every 3 years starting at age 45. If you are overweight or obese and you are 40-70 years of age, you should be screened for diabetes every year as part of your cardiovascular risk assessment.  Breast cancer screening is essential preventive care for women. You should practice "breast self-awareness." This means understanding the normal appearance and feel of your breasts and may include breast self-examination. Any changes detected, no matter how small, should be reported to a health care provider. Women in their 20s and 30s should have a clinical breast exam (CBE) by a health care provider as part of a regular health exam every 1 to 3 years. After age 40, women should have a CBE every year. Starting at age 40, women should consider having a mammogram (breast X-ray test) every year. Women who have a family history of breast cancer should talk to their health care provider about genetic screening. Women at a high risk of breast cancer should talk to their health care providers about having an MRI and a mammogram every year.  Breast cancer gene (BRCA)-related cancer risk assessment is recommended for women who have family members with BRCA-related cancers. BRCA-related cancers include breast, ovarian, tubal, and peritoneal cancers. Having family members with these cancers may be associated with an increased risk for harmful changes (mutations) in the breast cancer genes BRCA1 and  BRCA2. Results of the assessment will determine the need for genetic counseling and BRCA1 and BRCA2 testing.  Your health care provider may recommend that you be screened regularly for cancer of the pelvic organs (ovaries, uterus, and vagina). This screening involves a pelvic examination, including checking for microscopic changes to the surface of your cervix (Pap test). You may be encouraged to have this screening done every 3 years, beginning at age 21.  For women ages 30-65, health care providers may recommend pelvic exams and Pap testing every 3 years, or they may recommend the Pap and pelvic exam, combined with testing for human papilloma virus (HPV), every 5 years. Some types of HPV increase your risk of cervical cancer. Testing for HPV may also be done on women of any age with unclear Pap test results.  Other health care providers may not recommend any screening for nonpregnant women who are considered low risk for pelvic cancer and who do not have symptoms. Ask your health care provider if a screening pelvic exam is right for   you.  If you have had past treatment for cervical cancer or a condition that could lead to cancer, you need Pap tests and screening for cancer for at least 20 years after your treatment. If Pap tests have been discontinued, your risk factors (such as having a new sexual partner) need to be reassessed to determine if screening should resume. Some women have medical problems that increase the chance of getting cervical cancer. In these cases, your health care provider may recommend more frequent screening and Pap tests.  Colorectal cancer can be detected and often prevented. Most routine colorectal cancer screening begins at the age of 50 years and continues through age 75 years. However, your health care provider may recommend screening at an earlier age if you have risk factors for colon cancer. On a yearly basis, your health care provider may provide home test kits to check  for hidden blood in the stool. Use of a small camera at the end of a tube, to directly examine the colon (sigmoidoscopy or colonoscopy), can detect the earliest forms of colorectal cancer. Talk to your health care provider about this at age 50, when routine screening begins. Direct exam of the colon should be repeated every 5-10 years through age 75 years, unless early forms of precancerous polyps or small growths are found.  People who are at an increased risk for hepatitis B should be screened for this virus. You are considered at high risk for hepatitis B if:  You were born in a country where hepatitis B occurs often. Talk with your health care provider about which countries are considered high risk.  Your parents were born in a high-risk country and you have not received a shot to protect against hepatitis B (hepatitis B vaccine).  You have HIV or AIDS.  You use needles to inject street drugs.  You live with, or have sex with, someone who has hepatitis B.  You get hemodialysis treatment.  You take certain medicines for conditions like cancer, organ transplantation, and autoimmune conditions.  Hepatitis C blood testing is recommended for all people born from 1945 through 1965 and any individual with known risks for hepatitis C.  Practice safe sex. Use condoms and avoid high-risk sexual practices to reduce the spread of sexually transmitted infections (STIs). STIs include gonorrhea, chlamydia, syphilis, trichomonas, herpes, HPV, and human immunodeficiency virus (HIV). Herpes, HIV, and HPV are viral illnesses that have no cure. They can result in disability, cancer, and death.  You should be screened for sexually transmitted illnesses (STIs) including gonorrhea and chlamydia if:  You are sexually active and are younger than 24 years.  You are older than 24 years and your health care provider tells you that you are at risk for this type of infection.  Your sexual activity has changed  since you were last screened and you are at an increased risk for chlamydia or gonorrhea. Ask your health care provider if you are at risk.  If you are at risk of being infected with HIV, it is recommended that you take a prescription medicine daily to prevent HIV infection. This is called preexposure prophylaxis (PrEP). You are considered at risk if:  You are sexually active and do not regularly use condoms or know the HIV status of your partner(s).  You take drugs by injection.  You are sexually active with a partner who has HIV.  Talk with your health care provider about whether you are at high risk of being infected with HIV. If   you choose to begin PrEP, you should first be tested for HIV. You should then be tested every 3 months for as long as you are taking PrEP.  Osteoporosis is a disease in which the bones lose minerals and strength with aging. This can result in serious bone fractures or breaks. The risk of osteoporosis can be identified using a bone density scan. Women ages 67 years and over and women at risk for fractures or osteoporosis should discuss screening with their health care providers. Ask your health care provider whether you should take a calcium supplement or vitamin D to reduce the rate of osteoporosis.  Menopause can be associated with physical symptoms and risks. Hormone replacement therapy is available to decrease symptoms and risks. You should talk to your health care provider about whether hormone replacement therapy is right for you.  Use sunscreen. Apply sunscreen liberally and repeatedly throughout the day. You should seek shade when your shadow is shorter than you. Protect yourself by wearing long sleeves, pants, a wide-brimmed hat, and sunglasses year round, whenever you are outdoors.  Once a month, do a whole body skin exam, using a mirror to look at the skin on your back. Tell your health care provider of new moles, moles that have irregular borders, moles that  are larger than a pencil eraser, or moles that have changed in shape or color.  Stay current with required vaccines (immunizations).  Influenza vaccine. All adults should be immunized every year.  Tetanus, diphtheria, and acellular pertussis (Td, Tdap) vaccine. Pregnant women should receive 1 dose of Tdap vaccine during each pregnancy. The dose should be obtained regardless of the length of time since the last dose. Immunization is preferred during the 27th-36th week of gestation. An adult who has not previously received Tdap or who does not know her vaccine status should receive 1 dose of Tdap. This initial dose should be followed by tetanus and diphtheria toxoids (Td) booster doses every 10 years. Adults with an unknown or incomplete history of completing a 3-dose immunization series with Td-containing vaccines should begin or complete a primary immunization series including a Tdap dose. Adults should receive a Td booster every 10 years.  Varicella vaccine. An adult without evidence of immunity to varicella should receive 2 doses or a second dose if she has previously received 1 dose. Pregnant females who do not have evidence of immunity should receive the first dose after pregnancy. This first dose should be obtained before leaving the health care facility. The second dose should be obtained 4-8 weeks after the first dose.  Human papillomavirus (HPV) vaccine. Females aged 13-26 years who have not received the vaccine previously should obtain the 3-dose series. The vaccine is not recommended for use in pregnant females. However, pregnancy testing is not needed before receiving a dose. If a female is found to be pregnant after receiving a dose, no treatment is needed. In that case, the remaining doses should be delayed until after the pregnancy. Immunization is recommended for any person with an immunocompromised condition through the age of 61 years if she did not get any or all doses earlier. During the  3-dose series, the second dose should be obtained 4-8 weeks after the first dose. The third dose should be obtained 24 weeks after the first dose and 16 weeks after the second dose.  Zoster vaccine. One dose is recommended for adults aged 30 years or older unless certain conditions are present.  Measles, mumps, and rubella (MMR) vaccine. Adults born  before 1957 generally are considered immune to measles and mumps. Adults born in 1957 or later should have 1 or more doses of MMR vaccine unless there is a contraindication to the vaccine or there is laboratory evidence of immunity to each of the three diseases. A routine second dose of MMR vaccine should be obtained at least 28 days after the first dose for students attending postsecondary schools, health care workers, or international travelers. People who received inactivated measles vaccine or an unknown type of measles vaccine during 1963-1967 should receive 2 doses of MMR vaccine. People who received inactivated mumps vaccine or an unknown type of mumps vaccine before 1979 and are at high risk for mumps infection should consider immunization with 2 doses of MMR vaccine. For females of childbearing age, rubella immunity should be determined. If there is no evidence of immunity, females who are not pregnant should be vaccinated. If there is no evidence of immunity, females who are pregnant should delay immunization until after pregnancy. Unvaccinated health care workers born before 1957 who lack laboratory evidence of measles, mumps, or rubella immunity or laboratory confirmation of disease should consider measles and mumps immunization with 2 doses of MMR vaccine or rubella immunization with 1 dose of MMR vaccine.  Pneumococcal 13-valent conjugate (PCV13) vaccine. When indicated, a person who is uncertain of his immunization history and has no record of immunization should receive the PCV13 vaccine. All adults 65 years of age and older should receive this  vaccine. An adult aged 19 years or older who has certain medical conditions and has not been previously immunized should receive 1 dose of PCV13 vaccine. This PCV13 should be followed with a dose of pneumococcal polysaccharide (PPSV23) vaccine. Adults who are at high risk for pneumococcal disease should obtain the PPSV23 vaccine at least 8 weeks after the dose of PCV13 vaccine. Adults older than 58 years of age who have normal immune system function should obtain the PPSV23 vaccine dose at least 1 year after the dose of PCV13 vaccine.  Pneumococcal polysaccharide (PPSV23) vaccine. When PCV13 is also indicated, PCV13 should be obtained first. All adults aged 65 years and older should be immunized. An adult younger than age 65 years who has certain medical conditions should be immunized. Any person who resides in a nursing home or long-term care facility should be immunized. An adult smoker should be immunized. People with an immunocompromised condition and certain other conditions should receive both PCV13 and PPSV23 vaccines. People with human immunodeficiency virus (HIV) infection should be immunized as soon as possible after diagnosis. Immunization during chemotherapy or radiation therapy should be avoided. Routine use of PPSV23 vaccine is not recommended for American Indians, [place] Natives, or people younger than 65 years unless there are medical conditions that require PPSV23 vaccine. When indicated, people who have unknown immunization and have no record of immunization should receive PPSV23 vaccine. One-time revaccination 5 years after the first dose of PPSV23 is recommended for people aged 19-64 years who have chronic kidney failure, nephrotic syndrome, asplenia, or immunocompromised conditions. People who received 1-2 doses of PPSV23 before age 65 years should receive another dose of PPSV23 vaccine at age 65 years or later if at least 5 years have passed since the previous dose. Doses of PPSV23 are not  needed for people immunized with PPSV23 at or after age 65 years.  Meningococcal vaccine. Adults with asplenia or persistent complement component deficiencies should receive 2 doses of quadrivalent meningococcal conjugate (MenACWY-D) vaccine. The doses should be obtained   at least 2 months apart. Microbiologists working with certain meningococcal bacteria, [place] recruits, people at risk during an outbreak, and people who travel to or live in countries with a high rate of meningitis should be immunized. A first-year college student up through age 34 years who is living in a residence hall should receive a dose if she did not receive a dose on or after her 16th birthday. Adults who have certain high-risk conditions should receive one or more doses of vaccine.  Hepatitis A vaccine. Adults who wish to be protected from this disease, have certain high-risk conditions, work with hepatitis A-infected animals, work in hepatitis A research labs, or travel to or work in countries with a high rate of hepatitis A should be immunized. Adults who were previously unvaccinated and who anticipate close contact with an international adoptee during the first 60 days after arrival in the [place] from a country with a high rate of hepatitis A should be immunized.  Hepatitis B vaccine. Adults who wish to be protected from this disease, have certain high-risk conditions, may be exposed to blood or other infectious body fluids, are household contacts or sex partners of hepatitis B positive people, are clients or workers in certain care facilities, or travel to or work in countries with a high rate of hepatitis B should be immunized.  Haemophilus influenzae type b (Hib) vaccine. A previously unvaccinated person with asplenia or sickle cell disease or having a scheduled splenectomy should receive 1 dose of Hib vaccine. Regardless of previous immunization, a recipient of a hematopoietic stem cell transplant should receive a  3-dose series 6-12 months after her successful transplant. Hib vaccine is not recommended for adults with HIV infection. Preventive Services / Frequency Ages 35 to 4 years  Blood pressure check.** / Every 3-5 years.  Lipid and cholesterol check.** / Every 5 years beginning at age 60.  Clinical breast exam.** / Every 3 years for women in their 71s and 10s.  BRCA-related cancer risk assessment.** / For women who have family members with a BRCA-related cancer (breast, ovarian, tubal, or peritoneal cancers).  Pap test.** / Every 2 years from ages 76 through 26. Every 3 years starting at age 61 through age 76 or 93 with a history of 3 consecutive normal Pap tests.  HPV screening.** / Every 3 years from ages 37 through ages 60 to 51 with a history of 3 consecutive normal Pap tests.  Hepatitis C blood test.** / For any individual with known risks for hepatitis C.  Skin self-exam. / Monthly.  Influenza vaccine. / Every year.  Tetanus, diphtheria, and acellular pertussis (Tdap, Td) vaccine.** / Consult your health care provider. Pregnant women should receive 1 dose of Tdap vaccine during each pregnancy. 1 dose of Td every 10 years.  Varicella vaccine.** / Consult your health care provider. Pregnant females who do not have evidence of immunity should receive the first dose after pregnancy.  HPV vaccine. / 3 doses over 6 months, if 93 and younger. The vaccine is not recommended for use in pregnant females. However, pregnancy testing is not needed before receiving a dose.  Measles, mumps, rubella (MMR) vaccine.** / You need at least 1 dose of MMR if you were born in 1957 or later. You may also need a 2nd dose. For females of childbearing age, rubella immunity should be determined. If there is no evidence of immunity, females who are not pregnant should be vaccinated. If there is no evidence of immunity, females who are  pregnant should delay immunization until after pregnancy.  Pneumococcal  13-valent conjugate (PCV13) vaccine.** / Consult your health care provider.  Pneumococcal polysaccharide (PPSV23) vaccine.** / 1 to 2 doses if you smoke cigarettes or if you have certain conditions.  Meningococcal vaccine.** / 1 dose if you are age 68 to 8 years and a Market researcher living in a residence hall, or have one of several medical conditions, you need to get vaccinated against meningococcal disease. You may also need additional booster doses.  Hepatitis A vaccine.** / Consult your health care provider.  Hepatitis B vaccine.** / Consult your health care provider.  Haemophilus influenzae type b (Hib) vaccine.** / Consult your health care provider. Ages 7 to 53 years  Blood pressure check.** / Every year.  Lipid and cholesterol check.** / Every 5 years beginning at age 25 years.  Lung cancer screening. / Every year if you are aged 11-80 years and have a 30-pack-year history of smoking and currently smoke or have quit within the past 15 years. Yearly screening is stopped once you have quit smoking for at least 15 years or develop a health problem that would prevent you from having lung cancer treatment.  Clinical breast exam.** / Every year after age 48 years.  BRCA-related cancer risk assessment.** / For women who have family members with a BRCA-related cancer (breast, ovarian, tubal, or peritoneal cancers).  Mammogram.** / Every year beginning at age 41 years and continuing for as long as you are in good health. Consult with your health care provider.  Pap test.** / Every 3 years starting at age 65 years through age 37 or 70 years with a history of 3 consecutive normal Pap tests.  HPV screening.** / Every 3 years from ages 72 years through ages 60 to 40 years with a history of 3 consecutive normal Pap tests.  Fecal occult blood test (FOBT) of stool. / Every year beginning at age 21 years and continuing until age 5 years. You may not need to do this test if you get  a colonoscopy every 10 years.  Flexible sigmoidoscopy or colonoscopy.** / Every 5 years for a flexible sigmoidoscopy or every 10 years for a colonoscopy beginning at age 35 years and continuing until age 48 years.  Hepatitis C blood test.** / For all people born from [birth year] through 1965 and any individual with known risks for hepatitis C.  Skin self-exam. / Monthly.  Influenza vaccine. / Every year.  Tetanus, diphtheria, and acellular pertussis (Tdap/Td) vaccine.** / Consult your health care provider. Pregnant women should receive 1 dose of Tdap vaccine during each pregnancy. 1 dose of Td every 10 years.  Varicella vaccine.** / Consult your health care provider. Pregnant females who do not have evidence of immunity should receive the first dose after pregnancy.  Zoster vaccine.** / 1 dose for adults aged 30 years or older.  Measles, mumps, rubella (MMR) vaccine.** / You need at least 1 dose of MMR if you were born in 1957 or later. You may also need a second dose. For females of childbearing age, rubella immunity should be determined. If there is no evidence of immunity, females who are not pregnant should be vaccinated. If there is no evidence of immunity, females who are pregnant should delay immunization until after pregnancy.  Pneumococcal 13-valent conjugate (PCV13) vaccine.** / Consult your health care provider.  Pneumococcal polysaccharide (PPSV23) vaccine.** / 1 to 2 doses if you smoke cigarettes or if you have certain conditions.  Meningococcal vaccine.** /  Consult your health care provider.  Hepatitis A vaccine.** / Consult your health care provider.  Hepatitis B vaccine.** / Consult your health care provider.  Haemophilus influenzae type b (Hib) vaccine.** / Consult your health care provider. Ages 64 years and over  Blood pressure check.** / Every year.  Lipid and cholesterol check.** / Every 5 years beginning at age 23 years.  Lung cancer screening. / Every year if you  are aged 16-80 years and have a 30-pack-year history of smoking and currently smoke or have quit within the past 15 years. Yearly screening is stopped once you have quit smoking for at least 15 years or develop a health problem that would prevent you from having lung cancer treatment.  Clinical breast exam.** / Every year after age 74 years.  BRCA-related cancer risk assessment.** / For women who have family members with a BRCA-related cancer (breast, ovarian, tubal, or peritoneal cancers).  Mammogram.** / Every year beginning at age 44 years and continuing for as long as you are in good health. Consult with your health care provider.  Pap test.** / Every 3 years starting at age 58 years through age 22 or 39 years with 3 consecutive normal Pap tests. Testing can be stopped between 65 and 70 years with 3 consecutive normal Pap tests and no abnormal Pap or HPV tests in the past 10 years.  HPV screening.** / Every 3 years from ages 64 years through ages 70 or 61 years with a history of 3 consecutive normal Pap tests. Testing can be stopped between 65 and 70 years with 3 consecutive normal Pap tests and no abnormal Pap or HPV tests in the past 10 years.  Fecal occult blood test (FOBT) of stool. / Every year beginning at age 40 years and continuing until age 27 years. You may not need to do this test if you get a colonoscopy every 10 years.  Flexible sigmoidoscopy or colonoscopy.** / Every 5 years for a flexible sigmoidoscopy or every 10 years for a colonoscopy beginning at age 7 years and continuing until age 32 years.  Hepatitis C blood test.** / For all people born from [birth year] through 1963 and any individual with known risks for hepatitis C.  Osteoporosis screening.** / A one-time screening for women ages 30 years and over and women at risk for fractures or osteoporosis.  Skin self-exam. / Monthly.  Influenza vaccine. / Every year.  Tetanus, diphtheria, and acellular pertussis (Tdap/Td)  vaccine.** / 1 dose of Td every 10 years.  Varicella vaccine.** / Consult your health care provider.  Zoster vaccine.** / 1 dose for adults aged 35 years or older.  Pneumococcal 13-valent conjugate (PCV13) vaccine.** / Consult your health care provider.  Pneumococcal polysaccharide (PPSV23) vaccine.** / 1 dose for all adults aged 46 years and older.  Meningococcal vaccine.** / Consult your health care provider.  Hepatitis A vaccine.** / Consult your health care provider.  Hepatitis B vaccine.** / Consult your health care provider.  Haemophilus influenzae type b (Hib) vaccine.** / Consult your health care provider. ** Family history and personal history of risk and conditions may change your health care provider's recommendations.   This information is not intended to replace advice given to you by your health care provider. Make sure you discuss any questions you have with your health care provider.   Document Released: 08/30/2001 Document Revised: 07/25/2014 Document Reviewed: 11/29/2010 Elsevier Interactive Patient Education [employer].

## 2015-10-13 NOTE — Progress Notes (Signed)
Subjective:     Karen Gilmore is a 60 y.o. female and is here for a comprehensive physical exam. The patient reports no problems.  Social History   Social History  . Marital Status: Single    Spouse Name: N/A  . Number of Children: N/A  . Years of Education: N/A   Occupational History  . Not on file.   Social History Main Topics  . Smoking status: Never Smoker   . Smokeless tobacco: Not on file  . Alcohol Use: 1.8 oz/week    3 Glasses of wine per week     Comment:  socially  . Drug Use: No  . Sexual Activity: Not on file   Other Topics Concern  . Not on file   Social History Narrative   Health Maintenance  Topic Date Due  . Hepatitis C Screening  [birth year]  . HIV Screening  09/15/1972  . COLONOSCOPY  01/06/2015  . INFLUENZA VACCINE  10/16/2015 (Originally 02/16/2015)  . MAMMOGRAM  07/07/2016  . PAP SMEAR  07/07/2017  . TETANUS/TDAP  06/13/2023    The following portions of the patient's history were reviewed and updated as appropriate:  She  has a past medical history of Mitral click-murmur syndrome; Rectal bleeding; Constipation; and Rectal pain. She  does not have any pertinent problems on file. She  has past surgical history that includes G 3 P 2; Tubal ligation; Colonoscopy (2006); and Dilation and curettage of uterus (1995). Her family history includes Breast cancer (age of onset: 28) in her mother; Heart attack in her paternal grandfather; Heart attack (age of onset: 57) in her father; Heart disease in her father; Hyperlipidemia in her father. There is no history of Diabetes, Hypertension, or Stroke. She  reports that she has never smoked. She does not have any smokeless tobacco history on file. She reports that she drinks about 1.8 oz of alcohol per week. She reports that she does not use illicit drugs. She has a current medication list which includes the following prescription(s): cholecalciferol and multivitamin. Current Outpatient Prescriptions on File Prior  to Visit  Medication Sig Dispense Refill  . cholecalciferol (VITAMIN D) 1000 UNITS tablet Take 1,000 Units by mouth daily.    . Multiple Vitamin (MULTIVITAMIN) tablet Take 1 tablet by mouth daily.     No current facility-administered medications on file prior to visit.   She has No Known Allergies..  Review of Systems Review of Systems  Constitutional: Negative for activity change, appetite change and fatigue.  HENT: Negative for hearing loss, congestion, tinnitus and ear discharge.  dentist q64m Eyes: Negative for visual disturbance (see optho q1y -- vision corrected to 20/20 with glasses).  Respiratory: Negative for cough, chest tightness and shortness of breath.   Cardiovascular: Negative for chest pain, palpitations and leg swelling.  Gastrointestinal: Negative for abdominal pain, diarrhea, constipation and abdominal distention.  Genitourinary: Negative for urgency, frequency, decreased urine volume and difficulty urinating.  Musculoskeletal: Negative for back pain, arthralgias and gait problem.  Skin: Negative for color change, pallor and rash.  Neurological: Negative for dizziness, light-headedness, numbness and headaches.  Hematological: Negative for adenopathy. Does not bruise/bleed easily.  Psychiatric/Behavioral: Negative for suicidal ideas, confusion, sleep disturbance, self-injury, dysphoric mood, decreased concentration and agitation.       Objective:    BP 136/66 mmHg  Pulse 76  Temp(Src) 98.2 F (36.8 C) (Oral)  Ht 5\' 4"  (1.626 m)  Wt 135 lb 9.6 oz (61.508 kg)  BMI 23.26 kg/m2  SpO2 98%  General appearance: alert, cooperative, appears stated age and no distress Head: Normocephalic, without obvious abnormality, atraumatic Eyes: conjunctivae/corneas clear. PERRL, EOM's intact. Fundi benign. Ears: normal TM's and external ear canals both ears Nose: Nares normal. Septum midline. Mucosa normal. No drainage or sinus tenderness. Throat: lips, mucosa, and tongue  normal; teeth and gums normal Neck: no adenopathy, no carotid bruit, no JVD, supple, symmetrical, trachea midline and thyroid not enlarged, symmetric, no tenderness/mass/nodules Back: symmetric, no curvature. ROM normal. No CVA tenderness. Lungs: clear to auscultation bilaterally Breasts: gyn Heart: regular rate and rhythm, S1, S2 normal, no murmur, click, rub or gallop Abdomen: soft, non-tender; bowel sounds normal; no masses,  no organomegaly Pelvic: deferred---gyn Extremities: extremities normal, atraumatic, no cyanosis or edema Pulses: 2+ and symmetric Skin: Skin color, texture, turgor normal. No rashes or lesions Lymph nodes: Cervical, supraclavicular, and axillary nodes normal. Neurologic: Alert and oriented X 3, normal strength and tone. Normal symmetric reflexes. Normal coordination and gait Psych- no depression, no anxiety      Assessment:    Healthy female exam.      Plan:    ghm utd Check labs See After Visit Summary for Counseling Recommendations    1. Preventative health care  - Ambulatory referral to Gastroenterology - Comprehensive metabolic panel; Future - CBC with Differential/Platelet; Future - Lipid panel; Future - HIV antibody; Future - POCT urinalysis dipstick; Future - TSH; Future - Hepatitis C antibody; Future  2. Hyperlipidemia  - Comprehensive metabolic panel; Future - Lipid panel; Future  3. Encounter for screening for HIV  - HIV antibody; Future  4. Essential hypertension  - Comprehensive metabolic panel; Future - CBC with Differential/Platelet; Future - Lipid panel; Future - POCT urinalysis dipstick; Future - TSH; Future - Hepatitis C antibody; Future  5. Need for hepatitis C screening test  - Hepatitis C antibody; Future

## 2015-10-13 NOTE — Progress Notes (Signed)
Pre visit review using our clinic review tool, if applicable. No additional management support is needed unless otherwise documented below in the visit note. 

## 2015-10-14 ENCOUNTER — Other Ambulatory Visit (INDEPENDENT_AMBULATORY_CARE_PROVIDER_SITE_OTHER): Payer: BC Managed Care – PPO

## 2015-10-14 DIAGNOSIS — E785 Hyperlipidemia, unspecified: Secondary | ICD-10-CM | POA: Diagnosis not present

## 2015-10-14 DIAGNOSIS — Z1159 Encounter for screening for other viral diseases: Secondary | ICD-10-CM

## 2015-10-14 DIAGNOSIS — I10 Essential (primary) hypertension: Secondary | ICD-10-CM

## 2015-10-14 DIAGNOSIS — Z0000 Encounter for general adult medical examination without abnormal findings: Secondary | ICD-10-CM | POA: Diagnosis not present

## 2015-10-14 DIAGNOSIS — Z114 Encounter for screening for human immunodeficiency virus [HIV]: Secondary | ICD-10-CM

## 2015-10-14 LAB — CBC WITH DIFFERENTIAL/PLATELET
BASOS PCT: 1 % (ref 0.0–3.0)
Basophils Absolute: 0.1 10*3/uL (ref 0.0–0.1)
EOS ABS: 0.3 10*3/uL (ref 0.0–0.7)
EOS PCT: 4.5 % (ref 0.0–5.0)
HCT: 36.7 % (ref 36.0–46.0)
HEMOGLOBIN: 12.3 g/dL (ref 12.0–15.0)
LYMPHS ABS: 1.9 10*3/uL (ref 0.7–4.0)
Lymphocytes Relative: 34.7 % (ref 12.0–46.0)
MCHC: 33.5 g/dL (ref 30.0–36.0)
MCV: 91.1 fl (ref 78.0–100.0)
MONO ABS: 0.4 10*3/uL (ref 0.1–1.0)
Monocytes Relative: 6.3 % (ref 3.0–12.0)
NEUTROS PCT: 53.5 % (ref 43.0–77.0)
Neutro Abs: 3 10*3/uL (ref 1.4–7.7)
PLATELETS: 215 10*3/uL (ref 150.0–400.0)
RBC: 4.03 Mil/uL (ref 3.87–5.11)
RDW: 14.6 % (ref 11.5–15.5)
WBC: 5.6 10*3/uL (ref 4.0–10.5)

## 2015-10-14 LAB — COMPREHENSIVE METABOLIC PANEL
ALBUMIN: 4.1 g/dL (ref 3.5–5.2)
ALT: 13 U/L (ref 0–35)
AST: 16 U/L (ref 0–37)
Alkaline Phosphatase: 74 U/L (ref 39–117)
BUN: 17 mg/dL (ref 6–23)
CHLORIDE: 105 meq/L (ref 96–112)
CO2: 31 mEq/L (ref 19–32)
CREATININE: 0.7 mg/dL (ref 0.40–1.20)
Calcium: 9.2 mg/dL (ref 8.4–10.5)
GFR: 91.32 mL/min (ref >60.00)
GLUCOSE: 83 mg/dL (ref 70–99)
POTASSIUM: 4 meq/L (ref 3.5–5.1)
SODIUM: 141 meq/L (ref 135–145)
TOTAL PROTEIN: 6.7 g/dL (ref 6.0–8.3)
Total Bilirubin: 0.4 mg/dL (ref 0.2–1.2)

## 2015-10-14 LAB — TSH: TSH: 1.81 u[IU]/mL (ref 0.35–4.50)

## 2015-10-14 LAB — POCT URINALYSIS DIPSTICK
Bilirubin, UA: NEGATIVE
Blood, UA: NEGATIVE
Ketones, UA: NEGATIVE
LEUKOCYTES UA: NEGATIVE
NITRITE UA: NEGATIVE
SPEC GRAV UA: 1.02
UROBILINOGEN UA: NEGATIVE
pH, UA: 6

## 2015-10-14 LAB — LIPID PANEL
CHOLESTEROL: 185 mg/dL (ref 0–200)
HDL: 60 mg/dL (ref >39.00)
LDL Cholesterol: 111 mg/dL — ABNORMAL HIGH (ref 0–99)
NonHDL: 125.31
TRIGLYCERIDES: 74 mg/dL (ref 0.0–149.0)
Total CHOL/HDL Ratio: 3
VLDL: 14.8 mg/dL (ref 0.0–40.0)

## 2015-10-14 LAB — HEPATITIS C ANTIBODY: HCV Ab: NEGATIVE

## 2015-10-15 LAB — HIV ANTIBODY (ROUTINE TESTING W REFLEX): HIV: NONREACTIVE

## 2016-01-21 ENCOUNTER — Telehealth: Payer: Self-pay | Admitting: Family Medicine

## 2016-01-21 NOTE — Telephone Encounter (Signed)
Spoke with patient and she stated she has a history of Prolapsed internal hemorrhoids and she is afraid the colonoscopy prep will cause issues. She is hesitant about getting tie colonoscopy she wanted to know if there was an alternative prep or an alternative to the colonoscopy. Please advise or can this wait for Dr.Lowne. (i did tell her she may need to discuss with GI)    KP

## 2016-01-21 NOTE — Telephone Encounter (Signed)
Pt called in because she says that she and Dr. Etter Sjogren discussed a colonoscopy. She says that she has questions about it.    Please advise further.    CB: [phone]

## 2016-01-21 NOTE — Telephone Encounter (Signed)
If no family history of colon cancer or personal history of colon polyps. Can do a Cologuard for colon cancer screen

## 2016-01-22 NOTE — Telephone Encounter (Signed)
Detailed message left on the VM with the below recommendations. Cologuard information packet mailed to the home address and I advised the patient to call back when she receives it so we can proceed and to call if she has any questions or concerns.     KP

## 2016-02-02 ENCOUNTER — Telehealth: Payer: Self-pay | Admitting: Family Medicine

## 2016-02-02 NOTE — Telephone Encounter (Signed)
°  Relationship to patient: Self Can be reached: [phone]   Reason for call: Patient request to move forward with the Colo-guard. States insurance will cover it but may request medical records

## 2016-02-02 NOTE — Telephone Encounter (Signed)
Order via the portal.   KP

## 2016-02-25 LAB — COLOGUARD: COLOGUARD: NEGATIVE

## 2016-03-24 ENCOUNTER — Encounter: Payer: Self-pay | Admitting: Family Medicine

## 2016-07-12 ENCOUNTER — Telehealth: Payer: Self-pay | Admitting: Family Medicine

## 2016-07-12 NOTE — Telephone Encounter (Signed)
Spoke with patient regarding her [phone] bill from [employer]. Patient's Cologuard was not covered under insurance. I reached out to rep and was referred back to billing. Told patient alternatives was to appeal it or work out a Agricultural consultant with company. Patient was upset that it was not sent to a network lab and I informed her that we do not have control over who they company results through and we ask patients to check with insurance before we order. Referred back to office not where patient asked to proceed with cologuard because her insurance would cover but may request medical records. Patient thanked me for my time looking into the bill stated she was not happy with bill but understood.

## 2016-09-19 ENCOUNTER — Telehealth: Payer: Self-pay | Admitting: Family Medicine

## 2016-09-19 NOTE — Telephone Encounter (Signed)
Left message on patients voicemail and sent My Chart message informing patient that her appointment for 10/18/2016 has been cancelled and needs to be rescheduled.

## 2016-09-28 NOTE — Telephone Encounter (Signed)
Left second message on patients voicemail for patient to call and reschedule appointment.

## 2016-10-18 ENCOUNTER — Encounter: Payer: BC Managed Care – PPO | Admitting: Family Medicine

## 2016-11-14 ENCOUNTER — Encounter: Payer: BC Managed Care – PPO | Admitting: Family Medicine

## 2017-01-04 ENCOUNTER — Encounter: Payer: Self-pay | Admitting: *Deleted

## 2017-01-04 LAB — HM PAP SMEAR

## 2017-01-05 ENCOUNTER — Ambulatory Visit (INDEPENDENT_AMBULATORY_CARE_PROVIDER_SITE_OTHER): Payer: BC Managed Care – PPO | Admitting: Family Medicine

## 2017-01-05 ENCOUNTER — Encounter: Payer: Self-pay | Admitting: Family Medicine

## 2017-01-05 VITALS — BP 126/70 | HR 66 | Temp 98.2°F | Resp 16 | Ht 64.0 in | Wt 137.6 lb

## 2017-01-05 DIAGNOSIS — Z0000 Encounter for general adult medical examination without abnormal findings: Secondary | ICD-10-CM

## 2017-01-05 DIAGNOSIS — E785 Hyperlipidemia, unspecified: Secondary | ICD-10-CM | POA: Diagnosis not present

## 2017-01-05 DIAGNOSIS — Z23 Encounter for immunization: Secondary | ICD-10-CM | POA: Diagnosis not present

## 2017-01-05 NOTE — Patient Instructions (Signed)
Preventive Care 40-64 Years, Female Preventive care refers to lifestyle choices and visits with your health care provider that can promote health and wellness. What does preventive care include?  A yearly physical exam. This is also called an annual well check.  Dental exams once or twice a year.  Routine eye exams. Ask your health care provider how often you should have your eyes checked.  Personal lifestyle choices, including: ? Daily care of your teeth and gums. ? Regular physical activity. ? Eating a healthy diet. ? Avoiding tobacco and drug use. ? Limiting alcohol use. ? Practicing safe sex. ? Taking low-dose aspirin daily starting at age 58. ? Taking vitamin and mineral supplements as recommended by your health care provider. What happens during an annual well check? The services and screenings done by your health care provider during your annual well check will depend on your age, overall health, lifestyle risk factors, and family history of disease. Counseling Your health care provider may ask you questions about your:  Alcohol use.  Tobacco use.  Drug use.  Emotional well-being.  Home and relationship well-being.  Sexual activity.  Eating habits.  Work and work Statistician.  Method of birth control.  Menstrual cycle.  Pregnancy history.  Screening You may have the following tests or measurements:  Height, weight, and BMI.  Blood pressure.  Lipid and cholesterol levels. These may be checked every 5 years, or more frequently if you are over 81 years old.  Skin check.  Lung cancer screening. You may have this screening every year starting at age 78 if you have a 30-pack-year history of smoking and currently smoke or have quit within the past 15 years.  Fecal occult blood test (FOBT) of the stool. You may have this test every year starting at age 65.  Flexible sigmoidoscopy or colonoscopy. You may have a sigmoidoscopy every 5 years or a colonoscopy  every 10 years starting at age 30.  Hepatitis C blood test.  Hepatitis B blood test.  Sexually transmitted disease (STD) testing.  Diabetes screening. This is done by checking your blood sugar (glucose) after you have not eaten for a while (fasting). You may have this done every 1-3 years.  Mammogram. This may be done every 1-2 years. Talk to your health care provider about when you should start having regular mammograms. This may depend on whether you have a family history of breast cancer.  BRCA-related cancer screening. This may be done if you have a family history of breast, ovarian, tubal, or peritoneal cancers.  Pelvic exam and Pap test. This may be done every 3 years starting at age 80. Starting at age 36, this may be done every 5 years if you have a Pap test in combination with an HPV test.  Bone density scan. This is done to screen for osteoporosis. You may have this scan if you are at high risk for osteoporosis.  Discuss your test results, treatment options, and if necessary, the need for more tests with your health care provider. Vaccines Your health care provider may recommend certain vaccines, such as:  Influenza vaccine. This is recommended every year.  Tetanus, diphtheria, and acellular pertussis (Tdap, Td) vaccine. You may need a Td booster every 10 years.  Varicella vaccine. You may need this if you have not been vaccinated.  Zoster vaccine. You may need this after age 5.  Measles, mumps, and rubella (MMR) vaccine. You may need at least one dose of MMR if you were born in  1957 or later. You may also need a second dose.  Pneumococcal 13-valent conjugate (PCV13) vaccine. You may need this if you have certain conditions and were not previously vaccinated.  Pneumococcal polysaccharide (PPSV23) vaccine. You may need one or two doses if you smoke cigarettes or if you have certain conditions.  Meningococcal vaccine. You may need this if you have certain  conditions.  Hepatitis A vaccine. You may need this if you have certain conditions or if you travel or work in places where you may be exposed to hepatitis A.  Hepatitis B vaccine. You may need this if you have certain conditions or if you travel or work in places where you may be exposed to hepatitis B.  Haemophilus influenzae type b (Hib) vaccine. You may need this if you have certain conditions.  Talk to your health care provider about which screenings and vaccines you need and how often you need them. This information is not intended to replace advice given to you by your health care provider. Make sure you discuss any questions you have with your health care provider. Document Released: 07/31/2015 Document Revised: 03/23/2016 Document Reviewed: 05/05/2015 Elsevier Interactive Patient Education  2017 [employer].

## 2017-01-05 NOTE — Progress Notes (Signed)
Subjective:   I acted as a Education administrator for Dr. Carollee Herter.  [name], CMA   Karen Gilmore is a 61 y.o. female and is here for a comprehensive physical exam. The patient reports no problems.  Social History   Social History  . Marital status: Single    Spouse name: N/A  . Number of children: N/A  . Years of education: N/A   Occupational History  . Not on file.   Social History Main Topics  . Smoking status: Never Smoker  . Smokeless tobacco: Never Used  . Alcohol use 1.8 oz/week    3 Glasses of wine per week     Comment:  socially  . Drug use: No  . Sexual activity: Not on file   Other Topics Concern  . Not on file   Social History Narrative   Exercise- Walk, body pump, tennis   Health Maintenance  Topic Date Due  . INFLUENZA VACCINE  02/15/2017  . MAMMOGRAM  01/04/2018  . Fecal DNA (Cologuard)  02/25/2019  . PAP SMEAR  01/05/2020  . TETANUS/TDAP  06/13/2023  . Hepatitis C Screening  Completed  . HIV Screening  Completed    The following portions of the patient's history were reviewed and updated as appropriate:  She  has a past medical history of Constipation; Mitral click-murmur syndrome; Rectal bleeding; and Rectal pain. She  does not have any pertinent problems on file. She  has a past surgical history that includes G 3 P 2; Tubal ligation; Colonoscopy (2006); and Dilation and curettage of uterus (1995). Her family history includes Breast cancer (age of onset: 37) in her mother; Heart attack in her paternal grandfather; Heart attack (age of onset: 80) in her father; Heart disease in her father; Hyperlipidemia in her father. She  reports that she has never smoked. She has never used smokeless tobacco. She reports that she drinks about 1.8 oz of alcohol per week . She reports that she does not use drugs. She has a current medication list which includes the following prescription(s): cholecalciferol and multivitamin. Current Outpatient Prescriptions on File Prior to Visit   Medication Sig Dispense Refill  . cholecalciferol (VITAMIN D) 1000 UNITS tablet Take 1,000 Units by mouth daily.    . Multiple Vitamin (MULTIVITAMIN) tablet Take 1 tablet by mouth daily.     No current facility-administered medications on file prior to visit.    She has No Known Allergies..  Review of Systems Review of Systems  Constitutional: Negative for activity change, appetite change and fatigue.  HENT: Negative for hearing loss, congestion, tinnitus and ear discharge.  dentist q56m Eyes: Negative for visual disturbance (see optho q1y -- vision corrected to 20/20 with glasses).  Respiratory: Negative for cough, chest tightness and shortness of breath.   Cardiovascular: Negative for chest pain, palpitations and leg swelling.  Gastrointestinal: Negative for abdominal pain, diarrhea, constipation and abdominal distention.  Genitourinary: Negative for urgency, frequency, decreased urine volume and difficulty urinating.  Musculoskeletal: Negative for back pain, arthralgias and gait problem.  Skin: Negative for color change, pallor and rash.  Neurological: Negative for dizziness, light-headedness, numbness and headaches.  Hematological: Negative for adenopathy. Does not bruise/bleed easily.  Psychiatric/Behavioral: Negative for suicidal ideas, confusion, sleep disturbance, self-injury, dysphoric mood, decreased concentration and agitation.       Objective:    BP 126/70 (BP Location: Left Arm, Cuff Size: Normal)   Pulse 66   Temp 98.2 F (36.8 C) (Oral)   Resp 16   Ht 5'  4" (1.626 m)   Wt 137 lb 9.6 oz (62.4 kg)   SpO2 97%   BMI 23.62 kg/m  General appearance: alert, cooperative, appears stated age and no distress Head: Normocephalic, without obvious abnormality, atraumatic Eyes: conjunctivae/corneas clear. PERRL, EOM's intact. Fundi benign. Ears: normal TM's and external ear canals both ears Nose: Nares normal. Septum midline. Mucosa normal. No drainage or sinus  tenderness. Throat: lips, mucosa, and tongue normal; teeth and gums normal Neck: no adenopathy, no carotid bruit, no JVD, supple, symmetrical, trachea midline and thyroid not enlarged, symmetric, no tenderness/mass/nodules Back: symmetric, no curvature. ROM normal. No CVA tenderness. Lungs: clear to auscultation bilaterally Breasts: normal appearance, no masses or tenderness Heart: regular rate and rhythm, S1, S2 normal, no murmur, click, rub or gallop Abdomen: soft, non-tender; bowel sounds normal; no masses,  no organomegaly Pelvic: deferred Extremities: extremities normal, atraumatic, no cyanosis or edema Pulses: 2+ and symmetric Skin: Skin color, texture, turgor normal. No rashes or lesions Lymph nodes: Cervical, supraclavicular, and axillary nodes normal. Neurologic: Alert and oriented X 3, normal strength and tone. Normal symmetric reflexes. Normal coordination and gait    Assessment:    Healthy female exam.      Plan:     ghm utd Check labs See After Visit Summary for Counseling Recommendations    1. Preventative health care See above - CBC with Differential/Platelet; Future - Comprehensive metabolic panel; Future - Lipid panel; Future - TSH; Future - POCT Urinalysis Dipstick (Automated); Future  2. Hyperlipidemia, unspecified hyperlipidemia type Encouraged heart healthy diet, increase exercise, avoid trans fats, consider a krill oil cap daily- CBC with Differential/Platelet; Future - Comprehensive metabolic panel; Future - Lipid panel; Future - TSH; Future - POCT Urinalysis Dipstick (Automated); Future  3. Need for shingles vaccine   - Varicella-zoster vaccine IM (Shingrix)

## 2017-01-06 ENCOUNTER — Other Ambulatory Visit: Payer: Self-pay | Admitting: Obstetrics & Gynecology

## 2017-01-06 DIAGNOSIS — R928 Other abnormal and inconclusive findings on diagnostic imaging of breast: Secondary | ICD-10-CM

## 2017-01-08 ENCOUNTER — Encounter: Payer: Self-pay | Admitting: Family Medicine

## 2017-01-08 NOTE — Assessment & Plan Note (Signed)
Encouraged heart healthy diet, increase exercise, avoid trans fats, consider a krill oil cap daily 

## 2017-01-09 ENCOUNTER — Other Ambulatory Visit: Payer: BC Managed Care – PPO

## 2017-01-12 ENCOUNTER — Ambulatory Visit
Admission: RE | Admit: 2017-01-12 | Discharge: 2017-01-12 | Disposition: A | Discharge status: Home / Self Care | Payer: BC Managed Care – PPO | Source: Ambulatory Visit | Attending: Obstetrics & Gynecology | Admitting: Obstetrics & Gynecology

## 2017-01-12 ENCOUNTER — Other Ambulatory Visit (INDEPENDENT_AMBULATORY_CARE_PROVIDER_SITE_OTHER): Payer: BC Managed Care – PPO

## 2017-01-12 DIAGNOSIS — Z0000 Encounter for general adult medical examination without abnormal findings: Secondary | ICD-10-CM

## 2017-01-12 DIAGNOSIS — E785 Hyperlipidemia, unspecified: Secondary | ICD-10-CM

## 2017-01-12 DIAGNOSIS — R928 Other abnormal and inconclusive findings on diagnostic imaging of breast: Secondary | ICD-10-CM

## 2017-01-12 LAB — COMPREHENSIVE METABOLIC PANEL WITH GFR
ALT: 13 U/L (ref 0–35)
AST: 20 U/L (ref 0–37)
Albumin: 4.2 g/dL (ref 3.5–5.2)
Alkaline Phosphatase: 89 U/L (ref 39–117)
BUN: 12 mg/dL (ref 6–23)
CO2: 31 meq/L (ref 19–32)
Calcium: 9.6 mg/dL (ref 8.4–10.5)
Chloride: 103 meq/L (ref 96–112)
Creatinine, Ser: 0.81 mg/dL (ref 0.40–1.20)
GFR: 76.83 mL/min (ref >60.00)
Glucose, Bld: 81 mg/dL (ref 70–99)
Potassium: 4 meq/L (ref 3.5–5.1)
Sodium: 140 meq/L (ref 135–145)
Total Bilirubin: 0.5 mg/dL (ref 0.2–1.2)
Total Protein: 6.7 g/dL (ref 6.0–8.3)

## 2017-01-12 LAB — CBC WITH DIFFERENTIAL/PLATELET
BASOS PCT: 1.1 % (ref 0.0–3.0)
Basophils Absolute: 0.1 10*3/uL (ref 0.0–0.1)
EOS ABS: 0.2 10*3/uL (ref 0.0–0.7)
EOS PCT: 4.9 % (ref 0.0–5.0)
HEMATOCRIT: 42.4 % (ref 36.0–46.0)
HEMOGLOBIN: 14.3 g/dL (ref 12.0–15.0)
LYMPHS PCT: 45.9 % (ref 12.0–46.0)
Lymphs Abs: 2.2 10*3/uL (ref 0.7–4.0)
MCHC: 33.8 g/dL (ref 30.0–36.0)
MCV: 94.8 fl (ref 78.0–100.0)
Monocytes Absolute: 0.4 10*3/uL (ref 0.1–1.0)
Monocytes Relative: 7.3 % (ref 3.0–12.0)
Neutro Abs: 2 10*3/uL (ref 1.4–7.7)
Neutrophils Relative %: 40.8 % — ABNORMAL LOW (ref 43.0–77.0)
Platelets: 245 10*3/uL (ref 150.0–400.0)
RBC: 4.47 Mil/uL (ref 3.87–5.11)
RDW: 14 % (ref 11.5–15.5)
WBC: 4.8 10*3/uL (ref 4.0–10.5)

## 2017-01-12 LAB — POC URINALSYSI DIPSTICK (AUTOMATED)
BILIRUBIN UA: NEGATIVE
Blood, UA: NEGATIVE
Glucose, UA: NEGATIVE
Ketones, UA: NEGATIVE
LEUKOCYTES UA: NEGATIVE
NITRITE UA: NEGATIVE
PH UA: 6 (ref 5.0–8.0)
PROTEIN UA: NEGATIVE
Spec Grav, UA: 1.025 (ref 1.010–1.025)
Urobilinogen, UA: 0.2 E.U./dL

## 2017-01-12 LAB — LIPID PANEL
CHOLESTEROL: 196 mg/dL (ref 0–200)
HDL: 66.3 mg/dL (ref >39.00)
LDL CALC: 121 mg/dL — AB (ref 0–99)
NonHDL: 130.11
TRIGLYCERIDES: 48 mg/dL (ref 0.0–149.0)
Total CHOL/HDL Ratio: 3
VLDL: 9.6 mg/dL (ref 0.0–40.0)

## 2017-01-12 LAB — TSH: TSH: 2.42 u[IU]/mL (ref 0.35–4.50)

## 2017-01-17 ENCOUNTER — Other Ambulatory Visit: Payer: Self-pay | Admitting: Family Medicine

## 2017-01-17 DIAGNOSIS — E785 Hyperlipidemia, unspecified: Secondary | ICD-10-CM

## 2017-03-29 ENCOUNTER — Ambulatory Visit (INDEPENDENT_AMBULATORY_CARE_PROVIDER_SITE_OTHER): Payer: BC Managed Care – PPO | Admitting: Behavioral Health

## 2017-03-29 DIAGNOSIS — Z23 Encounter for immunization: Secondary | ICD-10-CM | POA: Diagnosis not present

## 2017-03-29 NOTE — Progress Notes (Signed)
Pre visit review using our clinic review tool, if applicable. No additional management support is needed unless otherwise documented below in the visit note.  Patient came in clinic for 2nd Shingrix vaccination. IM injection was given in the left deltoid. Patient tolerated injection well. No signs or symptoms of a reaction before leaving the nurse visit.

## 2017-04-19 ENCOUNTER — Other Ambulatory Visit (INDEPENDENT_AMBULATORY_CARE_PROVIDER_SITE_OTHER): Payer: BC Managed Care – PPO

## 2017-04-19 DIAGNOSIS — E785 Hyperlipidemia, unspecified: Secondary | ICD-10-CM

## 2017-04-19 LAB — COMPREHENSIVE METABOLIC PANEL
ALT: 14 U/L (ref 0–35)
AST: 15 U/L (ref 0–37)
Albumin: 4.2 g/dL (ref 3.5–5.2)
Alkaline Phosphatase: 85 U/L (ref 39–117)
BUN: 16 mg/dL (ref 6–23)
CHLORIDE: 103 meq/L (ref 96–112)
CO2: 32 meq/L (ref 19–32)
Calcium: 9.4 mg/dL (ref 8.4–10.5)
Creatinine, Ser: 0.72 mg/dL (ref 0.40–1.20)
GFR: 87.94 mL/min (ref >60.00)
GLUCOSE: 88 mg/dL (ref 70–99)
Potassium: 4.6 mEq/L (ref 3.5–5.1)
SODIUM: 140 meq/L (ref 135–145)
Total Bilirubin: 0.7 mg/dL (ref 0.2–1.2)
Total Protein: 7 g/dL (ref 6.0–8.3)

## 2017-04-19 LAB — LIPID PANEL
CHOL/HDL RATIO: 3
Cholesterol: 197 mg/dL (ref 0–200)
HDL: 67.8 mg/dL (ref >39.00)
LDL CALC: 116 mg/dL — AB (ref 0–99)
NONHDL: 129.68
Triglycerides: 67 mg/dL (ref 0.0–149.0)
VLDL: 13.4 mg/dL (ref 0.0–40.0)

## 2017-04-24 ENCOUNTER — Other Ambulatory Visit: Payer: Self-pay | Admitting: Family Medicine

## 2017-04-24 DIAGNOSIS — E785 Hyperlipidemia, unspecified: Secondary | ICD-10-CM

## 2017-09-20 ENCOUNTER — Ambulatory Visit: Payer: BC Managed Care – PPO | Admitting: Medical

## 2017-09-20 ENCOUNTER — Ambulatory Visit: Payer: Self-pay

## 2017-09-20 ENCOUNTER — Encounter: Payer: Self-pay | Admitting: Medical

## 2017-09-20 VITALS — BP 147/81 | HR 72 | Resp 16 | Ht 64.0 in | Wt 139.6 lb

## 2017-09-20 DIAGNOSIS — R21 Rash and other nonspecific skin eruption: Secondary | ICD-10-CM | POA: Diagnosis not present

## 2017-09-20 DIAGNOSIS — T7840XA Allergy, unspecified, initial encounter: Secondary | ICD-10-CM

## 2017-09-20 DIAGNOSIS — L989 Disorder of the skin and subcutaneous tissue, unspecified: Secondary | ICD-10-CM | POA: Diagnosis not present

## 2017-09-20 MED ORDER — METHYLPREDNISOLONE ACETATE 40 MG/ML IJ SUSP
40.0000 mg | Freq: Once | INTRAMUSCULAR | Status: AC
  Administered 2017-09-20: 40 mg via INTRAMUSCULAR

## 2017-09-20 MED ORDER — HYDROXYZINE HCL 10 MG PO TABS: 10.0000 mg | ORAL_TABLET | Freq: Three times a day (TID) | ORAL | 0 refills | Status: DC | PRN

## 2017-09-20 MED ORDER — CEPHALEXIN 500 MG PO CAPS: 500.0000 mg | ORAL_CAPSULE | Freq: Two times a day (BID) | ORAL | 0 refills | Status: DC

## 2017-09-20 MED ORDER — PREDNISONE 10 MG PO TABS: ORAL_TABLET | ORAL | 0 refills | Status: DC

## 2017-09-20 NOTE — Telephone Encounter (Signed)
Pt. Reports she donated blood Monday and noticed  Tuesday morning she has a rash and irritation at the left antecubital area. States she has had similar problems in the past after donating blood.Reports "this is the worst yet." Irritating and itchy. No pain. Has raised and flat areas - one area "looks like a burn." Wants to know what she is reacting to so "it won't happen again." Appointment for today.  Reason for Disposition . [1] Localized rash is very painful AND [2] no fever  Answer Assessment - Initial Assessment Questions 1. APPEARANCE of RASH: "Describe the rash."      Red . Raised and flat areas. One area looks like a burn. 2. LOCATION: "Where is the rash located?"      Left antecubital from donating blood 3. NUMBER: "How many spots are there?"      Unsure 4. SIZE: "How big are the spots?" (Inches, centimeters or compare to size of a coin)      Patchy 5. ONSET: "When did the rash start?"      Started Tuesday 6. ITCHING: "Does the rash itch?" If so, ask: "How bad is the itch?"  (Scale 1-10; or mild, moderate, severe)     Itches - 4 7. PAIN: "Does the rash hurt?" If so, ask: "How bad is the pain?"  (Scale 1-10; or mild, moderate, severe)     No 8. OTHER SYMPTOMS: "Do you have any other symptoms?" (e.g., fever)     No fever 9. PREGNANCY: "Is there any chance you are pregnant?" "When was your last menstrual period?"     No  Protocols used: RASH OR REDNESS - LOCALIZED-A-AH

## 2017-09-20 NOTE — Telephone Encounter (Signed)
Appt w/ Percell Miller later today. Sending as Juluis Rainier.

## 2017-09-20 NOTE — Progress Notes (Signed)
Subjective:    Patient ID: Karen Gilmore, female    DOB: August 25, 1955, 62 y.o.   MRN: [id number]  HPI  Pt gave/donated blood on Monday evening. Pt states next day she had moderate diffuse rash antecubital fossa and distal bicep on Tuesday morning. Small tiny area at first but then worsened quickly. Today the area is more red and itches moderatley. Mid rash ventral wrist as well.   Pt states last 2 times gave blood had faint rash in same area.  No sob or wheezing.   No fever, no chills or sweats.  Pt does not have history of sensitive skin. But does not couple of years ago.  Has seen allergist before and knows she is allergic to cats and dogs.  Also recently she noticed a change in features of a skin lesion on her left temporal region.  Changes are not dramatic but she can tell since she has been following the area closely.    Review of Systems  Constitutional: Negative for chills, fatigue and fever.  Respiratory: Negative for cough, chest tightness, shortness of breath and wheezing.   Cardiovascular: Negative for chest pain and palpitations.  Skin: Positive for rash.       See HPI.  See also physical exam.  Neurological: Negative for dizziness, weakness, light-headedness and headaches.  Hematological: Negative for adenopathy. Does not bruise/bleed easily.  Psychiatric/Behavioral: Negative for confusion.    Past Medical History:  Diagnosis Date  . Constipation   . 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 Rectal bleeding   . Rectal pain      Social History   Socioeconomic History  . Marital status: Single    Spouse name: Not on file  . Number of children: Not on file  . Years of education: Not on file  . Highest education level: Not on file  Social Needs  . Financial resource strain: Not on file  . Food insecurity - worry: Not on file  . Food insecurity - inability: Not on file  . Transportation needs - medical: Not on file  . Transportation needs -  non-medical: Not on file  Occupational History  . Not on file  Tobacco Use  . Smoking status: Never Smoker  . Smokeless tobacco: Never Used  Substance and Sexual Activity  . Alcohol use: Yes    Alcohol/week: 1.8 oz    Types: 3 Glasses of wine per week    Comment:  socially  . Drug use: No  . Sexual activity: Not on file  Other Topics Concern  . Not on file  Social History Narrative   Exercise- Walk, body pump, tennis    Past Surgical History:  Procedure Laterality Date  . COLONOSCOPY  2006   hemorrhoids;[facility]  . DILATION AND CURETTAGE OF UTERUS  1995  . G 3 P 2    . TUBAL LIGATION     Dr Alden Hipp    Family History  Problem Relation Age of Onset  . Heart attack Father 67  . Heart disease Father        MI at age 67  . Hyperlipidemia Father   . Breast cancer Mother 59  . Heart attack Paternal Grandfather        early 42s (NOT definite)  . Diabetes Neg Hx   . Hypertension Neg Hx   . Stroke Neg Hx     No Known Allergies  Current Outpatient Medications on File Prior to Visit  Medication  Sig Dispense Refill  . cholecalciferol (VITAMIN D) 1000 UNITS tablet Take 1,000 Units by mouth daily.    . Multiple Vitamin (MULTIVITAMIN) tablet Take 1 tablet by mouth daily.     No current facility-administered medications on file prior to visit.     BP (!) 147/81 (BP Location: Left Arm, Patient Position: Sitting, Cuff Size: Normal)   Pulse 72   Resp 16   Ht 5\' 4"  (1.626 m)   Wt 139 lb 9.6 oz (63.3 kg)   SpO2 99%   BMI 23.96 kg/m       Objective:   Physical Exam  General Mental Status- Alert. General Appearance- Not in acute distress.   Skin General: Color- Normal Color. Moisture- Normal Moisture.  Neck Carotid Arteries- Normal color. Moisture- Normal Moisture. No carotid bruits. No JVD.  Chest and Lung Exam Auscultation: Breath Sounds:-Normal.  Cardiovascular Auscultation:Rythm- Regular. Murmurs & Other Heart Sounds:Auscultation of the heart  reveals- No Murmurs.  Abdomen Inspection:-Inspeection Normal. Palpation/Percussion:Note:No mass. Palpation and Percussion of the abdomen reveal- Non Tender, Non Distended + BS, no rebound or guarding.    Neurologic Cranial Nerve exam:- CN III-XII intact(No nystagmus), symmetric smile. .Strength:- 5/5 equal and symmetric strength both upper and lower extremities.  Left upper extremity- patient has approximate 4-5 cm x 3 cm rash that extends from her antecubital fossa up to the distal aspect of the bicep.  Upon inspection her veins do not appear inflamed.  The vein that was likely used for the donation is not hard or tender.  But the area of redness is moderately warm and slightly tender to touch.   Skin-patient's left temporal area and she has what appears to be possible solar lentigo but the edges do look irregular and its a little bit raised.(patient states recent changes.)  Diameter of this area is about 8 mm wide    Assessment & Plan:  Rash in left antecubital area appears to be from the blood donation process.  Very suspicious since you had faint rash to area other times previously in the past year after giving blood.  This time worse rash with itching as well.  We gave you 40 mg IM of Depo-Medrol.  Also 5-day taper dose of prednisone.  Hydroxyzine prescription for itching.  Some concern that you have secondary skin infection based on the warmth of area.  Prescription of antibiotic Keflex sent to pharmacy.  You should gradually get better with the above treatment.  If you see expansion of rash or swelling of the arm then please let us know.   Referral to allergist placed.  For your left temporal skin lesion which has been changing slightly decided to go ahead and refer you to dermatologist.  Follow-up in 7 days or as needed.  Mackie Pai, PA-C

## 2017-09-20 NOTE — Patient Instructions (Addendum)
Rash in left antecubital area appears to be from the blood donation process.  Very suspicious since you had faint rash to area other times previously in the past year after giving blood.  This time worse rash with itching as well.  We gave you 40 mg IM of Depo-Medrol.  Also 5-day taper dose of prednisone.  Hydroxyzine prescription for itching.  Some concern that you have secondary skin infection based on the warmth of area.  Prescription of antibiotic Keflex sent to pharmacy.  You should gradually get better with the above treatment.  If you see expansion of rash or swelling of the arm then please let us know.   Referral to allergist placed.  For your left temporal skin lesion which has been changing slightly decided to go ahead and refer you to dermatologist.  Follow-up in 7 days or as needed.

## 2017-10-09 ENCOUNTER — Encounter: Payer: Self-pay | Admitting: Allergy & Immunology

## 2017-11-07 ENCOUNTER — Ambulatory Visit: Payer: BC Managed Care – PPO | Admitting: Allergy and Immunology

## 2017-11-07 ENCOUNTER — Encounter: Payer: Self-pay | Admitting: Allergy and Immunology

## 2017-11-07 DIAGNOSIS — T7840XA Allergy, unspecified, initial encounter: Secondary | ICD-10-CM | POA: Insufficient documentation

## 2017-11-07 DIAGNOSIS — Z889 Allergy status to unspecified drugs, medicaments and biological substances status: Secondary | ICD-10-CM | POA: Diagnosis not present

## 2017-11-07 DIAGNOSIS — J3089 Other allergic rhinitis: Secondary | ICD-10-CM

## 2017-11-07 DIAGNOSIS — T7840XD Allergy, unspecified, subsequent encounter: Secondary | ICD-10-CM

## 2017-11-07 MED ORDER — FLUTICASONE PROPIONATE 50 MCG/ACT NA SUSP: 2.0000 | Freq: Every day | NASAL | 5 refills | Status: DC

## 2017-11-07 NOTE — Assessment & Plan Note (Addendum)
The patient's history suggests hypersensitivity to ChloraPrep versus cellulitis.  The intense pruritus makes hypersensitivity to ChloraPrep more likely.  ChloraPrep contains chlorhexidine gluconate 2% and isopropyl alcohol 70%.  Latex sensitivity is less likely as she was touched by gloves on other parts of her arm and yet only developed the rash on the antecubital fossa.    As there are no skin or blood tests available to rule out hypersensitivity to chlorhexidine, a topical challenge would be the only means of assessing this possibility.  Until chlorhexidine hypersensitivity has been ruled out, povidone iodine should be used as an antiseptic agent.

## 2017-11-07 NOTE — Progress Notes (Signed)
New Patient Note  RE: HANNAH STRADER MRN: [id number] DOB: 23-Apr-1956 Date of Office Visit: 11/07/2017  Referring provider: Ann Held, * Primary care provider: Carollee Herter, Alferd Apa, DO  Chief Complaint: Rash and Allergic Reaction   History of present illness: FIANA GLADU is a 62 y.o. female seen today in consultation requested by Garnet Koyanagi, DO. Reports that she donates blood on a regular basis.  Recently, she donated blood in the next morning she noticed a "little red spot" where the needle had been inserted into her arm.  The next day, the lesion developed into "an angry rash" over her antecubital fossa.  She saw her primary care physician and, because the area was warm to the touch, she was treated with antibiotics for cellulitis she was also prescribed prednisone.  She called up the [employer] and was told that ChloraPrep was applied to clean the area prior to the blood draw.  She reports that the rash approximated the distribution of the ChloraPrep on her arm.  She did not experience concomitant generalized urticaria, angioedema, cardiopulmonary symptoms, or GI symptoms.   Rey notes that she recently has been experiencing clear rhinorrhea when going outdoors.  She denies symptoms consistent with respiratory tract infection.  Assessment and plan: Drug allergy The patient's history suggests hypersensitivity to ChloraPrep versus cellulitis.  The intense pruritus makes hypersensitivity to ChloraPrep more likely.  ChloraPrep contains chlorhexidine gluconate 2% and isopropyl alcohol 70%.  Latex sensitivity is less likely as she was touched by gloves on other parts of her arm and yet only developed the rash on the antecubital fossa.    As there are no skin or blood tests available to rule out hypersensitivity to chlorhexidine, a topical challenge would be the only means of assessing this possibility.  Until chlorhexidine hypersensitivity has been ruled out, povidone  iodine should be used as an antiseptic agent.  Other allergic rhinitis The patient's history suggests seasonal allergic rhinitis.  Allergy skin testing was deferred today.  A prescription has been provided for fluticasone nasal spray, one spray per nostril 1-2 times daily as needed. Proper nasal spray technique has been discussed and demonstrated.  Nasal saline lavage (NeilMed) has been recommended as needed and prior to medicated nasal sprays along with instructions for proper administration.  May add cetirizine (Zyrtec) 10 mg daily if needed or fexofenadine (Allegra) 180 mg daily if needed.   Meds ordered this encounter  Medications  . DISCONTD: fluticasone (FLONASE) 50 MCG/ACT nasal spray    Sig: Place 2 sprays into both nostrils daily.    Dispense:  1 g    Refill:  5  . fluticasone (FLONASE) 50 MCG/ACT nasal spray    Sig: Place 2 sprays into both nostrils daily.    Dispense:  1 g    Refill:  5     Physical examination: Blood pressure 106/70, pulse 66, temperature 98.4 F (36.9 C), temperature source Oral, resp. rate 16, height 5\' 3"  (1.6 m), weight 140 lb (63.5 kg), SpO2 98 %.  General: Alert, interactive, in no acute distress. Neck: Supple without lymphadenopathy. Lungs: Clear to auscultation without wheezing, rhonchi or rales. CV: Normal S1, S2 without murmurs. Abdomen: Nondistended, nontender. Skin: Warm and dry, without lesions or rashes. Extremities:  No clubbing, cyanosis or edema. Neuro:   Grossly intact.  Review of systems:  Review of systems negative except as noted in HPI / PMHx or noted below: Review of Systems  Constitutional: Negative.  HENT: Negative.   Eyes: Negative.   Respiratory: Negative.   Cardiovascular: Negative.   Gastrointestinal: Negative.   Genitourinary: Negative.   Musculoskeletal: Negative.   Skin: Negative.   Neurological: Negative.   Endo/Heme/Allergies: Negative.   Psychiatric/Behavioral: Negative.     Past medical history:    Past Medical History:  Diagnosis Date  . Constipation   . 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 Rectal bleeding   . Rectal pain     Past surgical history:  Past Surgical History:  Procedure Laterality Date  . COLONOSCOPY  2006   hemorrhoids;[facility]  . DILATION AND CURETTAGE OF UTERUS  1995  . G 3 P 2    . TUBAL LIGATION     Dr Alden Hipp    Family history: Family History  Problem Relation Age of Onset  . Heart attack Father 73  . Heart disease Father        MI at age 80  . Hyperlipidemia Father   . Breast cancer Mother 34  . Heart attack Paternal Grandfather        early 37s (NOT definite)  . Diabetes Neg Hx   . Hypertension Neg Hx   . Stroke Neg Hx     Social history: Social History   Socioeconomic History  . Marital status: Single    Spouse name: Not on file  . Number of children: Not on file  . Years of education: Not on file  . Highest education level: Not on file  Occupational History  . Not on file  Social Needs  . Financial resource strain: Not on file  . Food insecurity:    Worry: Not on file    Inability: Not on file  . Transportation needs:    Medical: Not on file    Non-medical: Not on file  Tobacco Use  . Smoking status: Never Smoker  . Smokeless tobacco: Never Used  Substance and Sexual Activity  . Alcohol use: Yes    Alcohol/week: 1.8 oz    Types: 3 Glasses of wine per week    Comment:  socially  . Drug use: No  . Sexual activity: Not on file  Lifestyle  . Physical activity:    Days per week: Not on file    Minutes per session: Not on file  . Stress: Not on file  Relationships  . Social connections:    Talks on phone: Not on file    Gets together: Not on file    Attends religious service: Not on file    Active member of club or organization: Not on file    Attends meetings of clubs or organizations: Not on file    Relationship status: Not on file  . Intimate partner violence:    Fear of current or ex  partner: Not on file    Emotionally abused: Not on file    Physically abused: Not on file    Forced sexual activity: Not on file  Other Topics Concern  . Not on file  Social History Narrative   Exercise- Walk, body pump, tennis   Environmental History: The patient lives in a 60 year old house with carpeting the bedroom, gas heat, and central air.  There are 2 dogs in the home, 1 of which has access to her bedroom.  There is water damage in the home.  She is a non-smoker.  Allergies as of 11/07/2017   No Known Allergies     Medication List  Accurate as of 11/07/17  5:41 PM. Always use your most recent med list.          cholecalciferol 1000 units tablet Commonly known as:  VITAMIN D Take 1,000 Units by mouth daily.   Fish Oil 1000 MG Caps Take 1 capsule by mouth daily.   fluticasone 50 MCG/ACT nasal spray Commonly known as:  FLONASE Place 2 sprays into both nostrils daily.   multivitamin tablet Take 1 tablet by mouth daily.       Known medication allergies: No Known Allergies  I appreciate the opportunity to take part in Lorilyn's care. Please do not hesitate to contact me with questions.  Sincerely,   R. Edgar Frisk, MD

## 2017-11-07 NOTE — Assessment & Plan Note (Addendum)
The patient's history suggests seasonal allergic rhinitis.  Allergy skin testing was deferred today.  A prescription has been provided for fluticasone nasal spray, one spray per nostril 1-2 times daily as needed. Proper nasal spray technique has been discussed and demonstrated.  Nasal saline lavage (NeilMed) has been recommended as needed and prior to medicated nasal sprays along with instructions for proper administration.  May add cetirizine (Zyrtec) 10 mg daily if needed or fexofenadine (Allegra) 180 mg daily if needed.

## 2017-11-07 NOTE — Patient Instructions (Addendum)
Drug allergy The patient's history suggests hypersensitivity to ChloraPrep versus cellulitis.  The intense pruritus makes hypersensitivity to ChloraPrep more likely.  ChloraPrep contains chlorhexidine gluconate 2% and isopropyl alcohol 70%.  Latex sensitivity is less likely as she was touched by gloves on other parts of her arm and yet only developed the rash on the antecubital fossa.    As there are no skin or blood tests available to rule out hypersensitivity to chlorhexidine, a topical challenge would be the only means of assessing this possibility.  Until chlorhexidine hypersensitivity has been ruled out, povidone iodine should be used as an antiseptic agent.  Other allergic rhinitis The patient's history suggests seasonal allergic rhinitis.  Allergy skin testing was deferred today.  A prescription has been provided for fluticasone nasal spray, one spray per nostril 1-2 times daily as needed. Proper nasal spray technique has been discussed and demonstrated.  Nasal saline lavage (NeilMed) has been recommended as needed and prior to medicated nasal sprays along with instructions for proper administration.  May add cetirizine (Zyrtec) 10 mg daily if needed or fexofenadine (Allegra) 180 mg daily if needed.

## 2018-01-11 ENCOUNTER — Ambulatory Visit (INDEPENDENT_AMBULATORY_CARE_PROVIDER_SITE_OTHER): Payer: BC Managed Care – PPO | Admitting: Family Medicine

## 2018-01-11 ENCOUNTER — Ambulatory Visit (HOSPITAL_BASED_OUTPATIENT_CLINIC_OR_DEPARTMENT_OTHER)
Admission: RE | Admit: 2018-01-11 | Discharge: 2018-01-11 | Disposition: A | Discharge status: Home / Self Care | Payer: BC Managed Care – PPO | Source: Ambulatory Visit | Attending: Family Medicine | Admitting: Family Medicine

## 2018-01-11 ENCOUNTER — Encounter: Payer: Self-pay | Admitting: Family Medicine

## 2018-01-11 VITALS — BP 110/80 | HR 67 | Temp 98.3°F | Resp 16 | Ht 64.5 in | Wt 140.6 lb

## 2018-01-11 DIAGNOSIS — Z0000 Encounter for general adult medical examination without abnormal findings: Secondary | ICD-10-CM

## 2018-01-11 DIAGNOSIS — Z0001 Encounter for general adult medical examination with abnormal findings: Secondary | ICD-10-CM

## 2018-01-11 DIAGNOSIS — M25551 Pain in right hip: Secondary | ICD-10-CM | POA: Insufficient documentation

## 2018-01-11 LAB — COMPREHENSIVE METABOLIC PANEL
ALBUMIN: 4.4 g/dL (ref 3.5–5.2)
ALT: 14 U/L (ref 0–35)
AST: 18 U/L (ref 0–37)
Alkaline Phosphatase: 93 U/L (ref 39–117)
BILIRUBIN TOTAL: 0.5 mg/dL (ref 0.2–1.2)
BUN: 15 mg/dL (ref 6–23)
CO2: 32 meq/L (ref 19–32)
CREATININE: 0.68 mg/dL (ref 0.40–1.20)
Calcium: 9.6 mg/dL (ref 8.4–10.5)
Chloride: 103 mEq/L (ref 96–112)
GFR: 93.71 mL/min (ref >60.00)
GLUCOSE: 79 mg/dL (ref 70–99)
Potassium: 4.3 mEq/L (ref 3.5–5.1)
SODIUM: 140 meq/L (ref 135–145)
TOTAL PROTEIN: 6.9 g/dL (ref 6.0–8.3)

## 2018-01-11 LAB — LIPID PANEL
CHOL/HDL RATIO: 3
Cholesterol: 225 mg/dL — ABNORMAL HIGH (ref 0–200)
HDL: 71 mg/dL (ref >39.00)
LDL Cholesterol: 142 mg/dL — ABNORMAL HIGH (ref 0–99)
NONHDL: 154.45
Triglycerides: 64 mg/dL (ref 0.0–149.0)
VLDL: 12.8 mg/dL (ref 0.0–40.0)

## 2018-01-11 LAB — CBC WITH DIFFERENTIAL/PLATELET
BASOS PCT: 0.7 % (ref 0.0–3.0)
Basophils Absolute: 0 10*3/uL (ref 0.0–0.1)
EOS ABS: 0.2 10*3/uL (ref 0.0–0.7)
EOS PCT: 4.5 % (ref 0.0–5.0)
HEMATOCRIT: 42.5 % (ref 36.0–46.0)
Hemoglobin: 14.3 g/dL (ref 12.0–15.0)
Lymphocytes Relative: 33.1 % (ref 12.0–46.0)
Lymphs Abs: 1.7 10*3/uL (ref 0.7–4.0)
MCHC: 33.7 g/dL (ref 30.0–36.0)
MCV: 94.4 fl (ref 78.0–100.0)
MONOS PCT: 8.6 % (ref 3.0–12.0)
Monocytes Absolute: 0.4 10*3/uL (ref 0.1–1.0)
NEUTROS ABS: 2.8 10*3/uL (ref 1.4–7.7)
Neutrophils Relative %: 53.1 % (ref 43.0–77.0)
Platelets: 228 10*3/uL (ref 150.0–400.0)
RBC: 4.5 Mil/uL (ref 3.87–5.11)
RDW: 14.1 % (ref 11.5–15.5)
WBC: 5.2 10*3/uL (ref 4.0–10.5)

## 2018-01-11 LAB — TSH: TSH: 1.92 u[IU]/mL (ref 0.35–4.50)

## 2018-01-11 NOTE — Patient Instructions (Signed)
Preventive Care 40-64 Years, Female Preventive care refers to lifestyle choices and visits with your health care provider that can promote health and wellness. What does preventive care include?  A yearly physical exam. This is also called an annual well check.  Dental exams once or twice a year.  Routine eye exams. Ask your health care provider how often you should have your eyes checked.  Personal lifestyle choices, including: ? Daily care of your teeth and gums. ? Regular physical activity. ? Eating a healthy diet. ? Avoiding tobacco and drug use. ? Limiting alcohol use. ? Practicing safe sex. ? Taking low-dose aspirin daily starting at age 58. ? Taking vitamin and mineral supplements as recommended by your health care provider. What happens during an annual well check? The services and screenings done by your health care provider during your annual well check will depend on your age, overall health, lifestyle risk factors, and family history of disease. Counseling Your health care provider may ask you questions about your:  Alcohol use.  Tobacco use.  Drug use.  Emotional well-being.  Home and relationship well-being.  Sexual activity.  Eating habits.  Work and work Statistician.  Method of birth control.  Menstrual cycle.  Pregnancy history.  Screening You may have the following tests or measurements:  Height, weight, and BMI.  Blood pressure.  Lipid and cholesterol levels. These may be checked every 5 years, or more frequently if you are over 81 years old.  Skin check.  Lung cancer screening. You may have this screening every year starting at age 78 if you have a 30-pack-year history of smoking and currently smoke or have quit within the past 15 years.  Fecal occult blood test (FOBT) of the stool. You may have this test every year starting at age 65.  Flexible sigmoidoscopy or colonoscopy. You may have a sigmoidoscopy every 5 years or a colonoscopy  every 10 years starting at age 30.  Hepatitis C blood test.  Hepatitis B blood test.  Sexually transmitted disease (STD) testing.  Diabetes screening. This is done by checking your blood sugar (glucose) after you have not eaten for a while (fasting). You may have this done every 1-3 years.  Mammogram. This may be done every 1-2 years. Talk to your health care provider about when you should start having regular mammograms. This may depend on whether you have a family history of breast cancer.  BRCA-related cancer screening. This may be done if you have a family history of breast, ovarian, tubal, or peritoneal cancers.  Pelvic exam and Pap test. This may be done every 3 years starting at age 80. Starting at age 36, this may be done every 5 years if you have a Pap test in combination with an HPV test.  Bone density scan. This is done to screen for osteoporosis. You may have this scan if you are at high risk for osteoporosis.  Discuss your test results, treatment options, and if necessary, the need for more tests with your health care provider. Vaccines Your health care provider may recommend certain vaccines, such as:  Influenza vaccine. This is recommended every year.  Tetanus, diphtheria, and acellular pertussis (Tdap, Td) vaccine. You may need a Td booster every 10 years.  Varicella vaccine. You may need this if you have not been vaccinated.  Zoster vaccine. You may need this after age 5.  Measles, mumps, and rubella (MMR) vaccine. You may need at least one dose of MMR if you were born in  1957 or later. You may also need a second dose.  Pneumococcal 13-valent conjugate (PCV13) vaccine. You may need this if you have certain conditions and were not previously vaccinated.  Pneumococcal polysaccharide (PPSV23) vaccine. You may need one or two doses if you smoke cigarettes or if you have certain conditions.  Meningococcal vaccine. You may need this if you have certain  conditions.  Hepatitis A vaccine. You may need this if you have certain conditions or if you travel or work in places where you may be exposed to hepatitis A.  Hepatitis B vaccine. You may need this if you have certain conditions or if you travel or work in places where you may be exposed to hepatitis B.  Haemophilus influenzae type b (Hib) vaccine. You may need this if you have certain conditions.  Talk to your health care provider about which screenings and vaccines you need and how often you need them. This information is not intended to replace advice given to you by your health care provider. Make sure you discuss any questions you have with your health care provider. Document Released: 07/31/2015 Document Revised: 03/23/2016 Document Reviewed: 05/05/2015 Elsevier Interactive Patient Education  2018 [employer]  

## 2018-01-11 NOTE — Assessment & Plan Note (Signed)
X-ray today

## 2018-01-11 NOTE — Progress Notes (Signed)
Subjective:     Karen Gilmore is a 62 y.o. female and is here for a comprehensive physical exam. The patient reports problems - muscle ache R buttock.  only bothers her when she sits for a long time.  Pt does not radiate down her leg.  She walks regularly, and plays tennis.  It never bothers her then .     Social History   Socioeconomic History  . Marital status: Single    Spouse name: Not on file  . Number of children: Not on file  . Years of education: Not on file  . Highest education level: Not on file  Occupational History  . Not on file  Social Needs  . Financial resource strain: Not on file  . Food insecurity:    Worry: Not on file    Inability: Not on file  . Transportation needs:    Medical: Not on file    Non-medical: Not on file  Tobacco Use  . Smoking status: Never Smoker  . Smokeless tobacco: Never Used  Substance and Sexual Activity  . Alcohol use: Yes    Alcohol/week: 1.8 oz    Types: 3 Glasses of wine per week    Comment:  socially  . Drug use: No  . Sexual activity: Not on file  Lifestyle  . Physical activity:    Days per week: Not on file    Minutes per session: Not on file  . Stress: Not on file  Relationships  . Social connections:    Talks on phone: Not on file    Gets together: Not on file    Attends religious service: Not on file    Active member of club or organization: Not on file    Attends meetings of clubs or organizations: Not on file    Relationship status: Not on file  . Intimate partner violence:    Fear of current or ex partner: Not on file    Emotionally abused: Not on file    Physically abused: Not on file    Forced sexual activity: Not on file  Other Topics Concern  . Not on file  Social History Narrative   Exercise- Walk, body pump, tennis   Health Maintenance  Topic Date Due  . MAMMOGRAM  01/04/2018  . INFLUENZA VACCINE  02/15/2018  . Fecal DNA (Cologuard)  02/25/2019  . PAP SMEAR  01/05/2020  . TETANUS/TDAP  06/13/2023   . Hepatitis C Screening  Completed  . HIV Screening  Completed    The following portions of the patient's history were reviewed and updated as appropriate:  She  has a past medical history of Constipation, Mitral click-murmur syndrome, Rectal bleeding, and Rectal pain. She does not have any pertinent problems on file. She  has a past surgical history that includes G 3 P 2; Tubal ligation; Colonoscopy (2006); and Dilation and curettage of uterus (1995). Her family history includes Breast cancer (age of onset: 74) in her mother; Heart attack in her paternal grandfather; Heart attack (age of onset: 57) in her father; Heart disease in her father; Hyperlipidemia in her father. She  reports that she has never smoked. She has never used smokeless tobacco. She reports that she drinks about 1.8 oz of alcohol per week. She reports that she does not use drugs. She has a current medication list which includes the following prescription(s): cholecalciferol, fluticasone, multivitamin, and fish oil. Current Outpatient Medications on File Prior to Visit  Medication Sig Dispense Refill  .  cholecalciferol (VITAMIN D) 1000 UNITS tablet Take 1,000 Units by mouth daily.    . fluticasone (FLONASE) 50 MCG/ACT nasal spray Place 2 sprays into both nostrils daily. 1 g 5  . Multiple Vitamin (MULTIVITAMIN) tablet Take 1 tablet by mouth daily.    . Omega-3 Fatty Acids (FISH OIL) 1000 MG CAPS Take 1 capsule by mouth daily.     No current facility-administered medications on file prior to visit.    She is allergic to chlorhexidine gluconate..  Review of Systems Review of Systems  Constitutional: Negative for activity change, appetite change and fatigue.  HENT: Negative for hearing loss, congestion, tinnitus and ear discharge.  dentist q2m Eyes: Negative for visual disturbance (see optho q1y -- vision corrected to 20/20 with glasses).  Respiratory: Negative for cough, chest tightness and shortness of breath.    Cardiovascular: Negative for chest pain, palpitations and leg swelling.  Gastrointestinal: Negative for abdominal pain, diarrhea, constipation and abdominal distention.  Genitourinary: Negative for urgency, frequency, decreased urine volume and difficulty urinating.  Musculoskeletal: Negative for back pain, arthralgias and gait problem.  Skin: Negative for color change, pallor and rash.  Neurological: Negative for dizziness, light-headedness, numbness and headaches.  Hematological: Negative for adenopathy. Does not bruise/bleed easily.  Psychiatric/Behavioral: Negative for suicidal ideas, confusion, sleep disturbance, self-injury, dysphoric mood, decreased concentration and agitation.       Objective:    BP 110/80 (BP Location: Left Arm, Cuff Size: Normal)   Pulse 67   Temp 98.3 F (36.8 C) (Oral)   Resp 16   Ht 5' 4.5" (1.638 m)   Wt 140 lb 9.6 oz (63.8 kg)   SpO2 98%   BMI 23.76 kg/m  General appearance: alert, cooperative, appears stated age and no distress Head: Normocephalic, without obvious abnormality, atraumatic Eyes: negative findings: lids and lashes normal, conjunctivae and sclerae normal and pupils equal, round, reactive to light and accomodation Ears: normal TM's and external ear canals both ears Nose: Nares normal. Septum midline. Mucosa normal. No drainage or sinus tenderness. Throat: lips, mucosa, and tongue normal; teeth and gums normal Neck: no adenopathy, no carotid bruit, no JVD, supple, symmetrical, trachea midline and thyroid not enlarged, symmetric, no tenderness/mass/nodules Back: symmetric, no curvature. ROM normal. No CVA tenderness. Lungs: clear to auscultation bilaterally Breasts: gyn Heart: regular rate and rhythm, S1, S2 normal, no murmur, click, rub or gallop Abdomen: soft, non-tender; bowel sounds normal; no masses,  no organomegaly Pelvic: deferred--gyn Extremities: extremities normal, atraumatic, no cyanosis or edema Pulses: 2+ and  symmetric Skin: Skin color, texture, turgor normal. No rashes or lesions Lymph nodes: Cervical, supraclavicular, and axillary nodes normal. Neurologic: Alert and oriented X 3, normal strength and tone. Normal symmetric reflexes. Normal coordination and gait    Assessment:    Healthy female exam.      Plan:    ghm utd Check labs  See AVS See After Visit Summary for Counseling Recommendations    1. Preventative health care See above - CBC with Differential/Platelet - Comprehensive metabolic panel - Lipid panel - TSH  2. Right hip pain  rto if symptoms do not improve or worsen - DG HIP UNILAT WITH PELVIS 2-3 VIEWS RIGHT; Future

## 2018-04-09 IMAGING — MG 2D DIGITAL DIAGNOSTIC UNILATERAL RIGHT MAMMOGRAM WITH CAD AND AD
6 of 9 series · 6 of 21 positions shown · non-contrast
Comparison: January 04, 2017 and multiple priors

CLINICAL DATA: 61-year-old patient recalled from recent screening
mammogram for possible asymmetry identified posteriorly on the MLO
view of the right breast.

EXAM:
2D DIGITAL DIAGNOSTIC UNILATERAL RIGHT MAMMOGRAM WITH CAD AND
ADJUNCT TOMO

[R CC synth-2D]
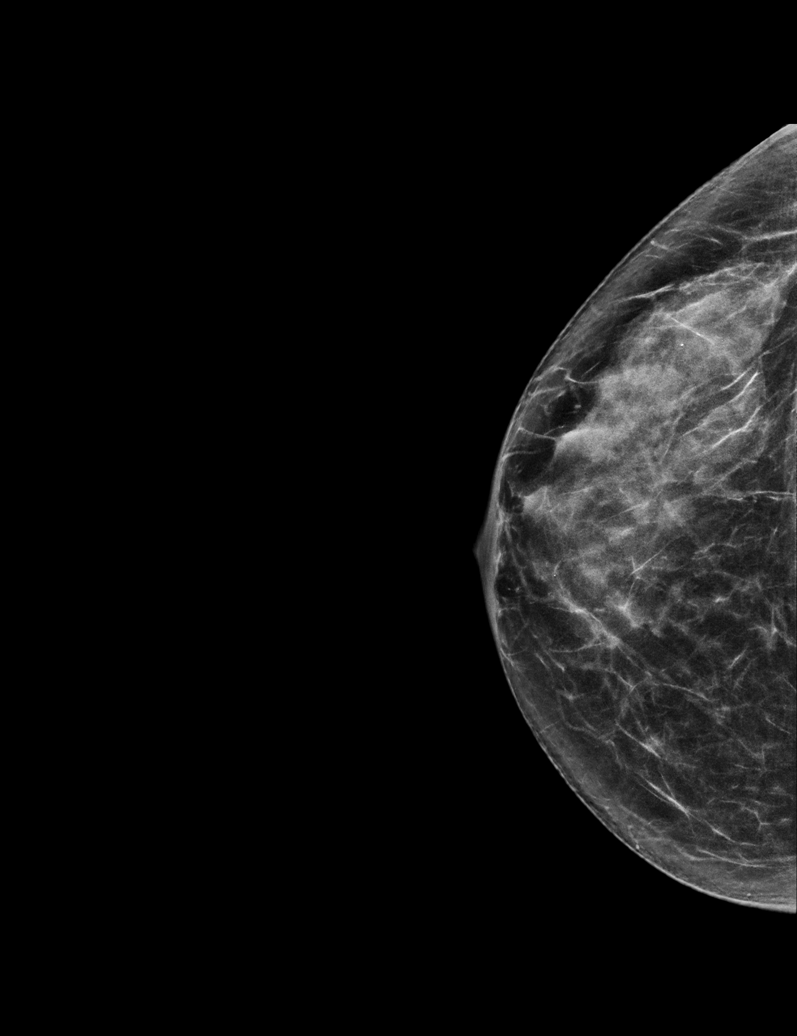

[R MLO (1 of 2)]
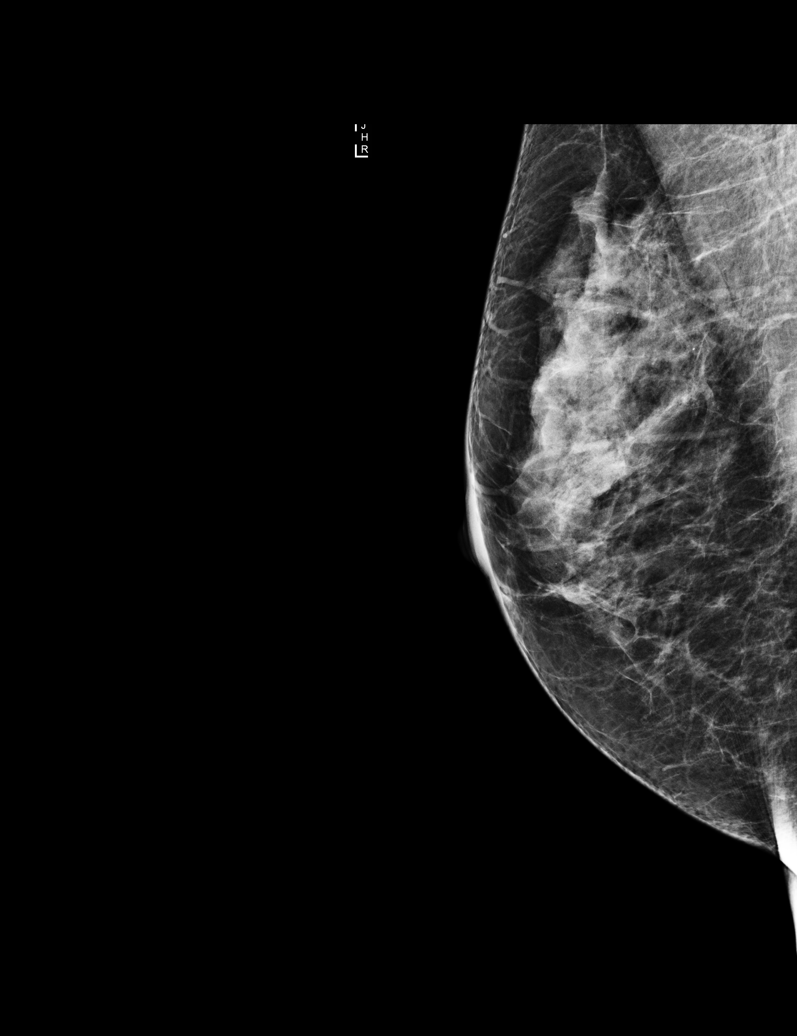

[R MLO (2 of 2)]
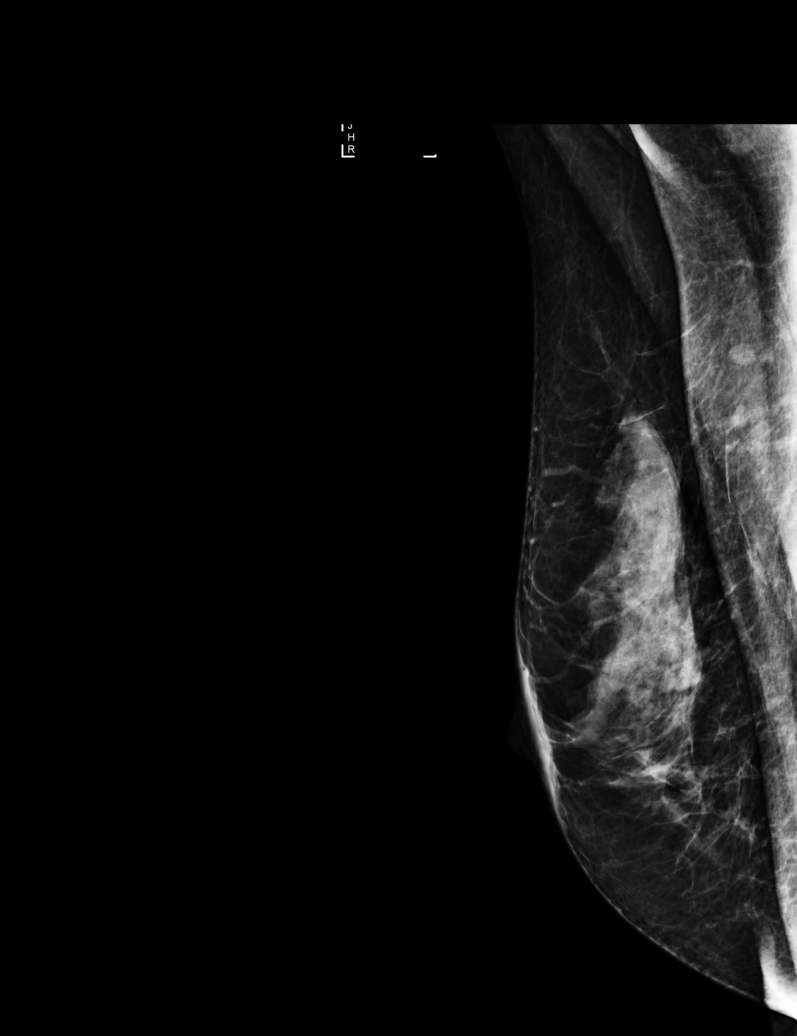

[R CC]
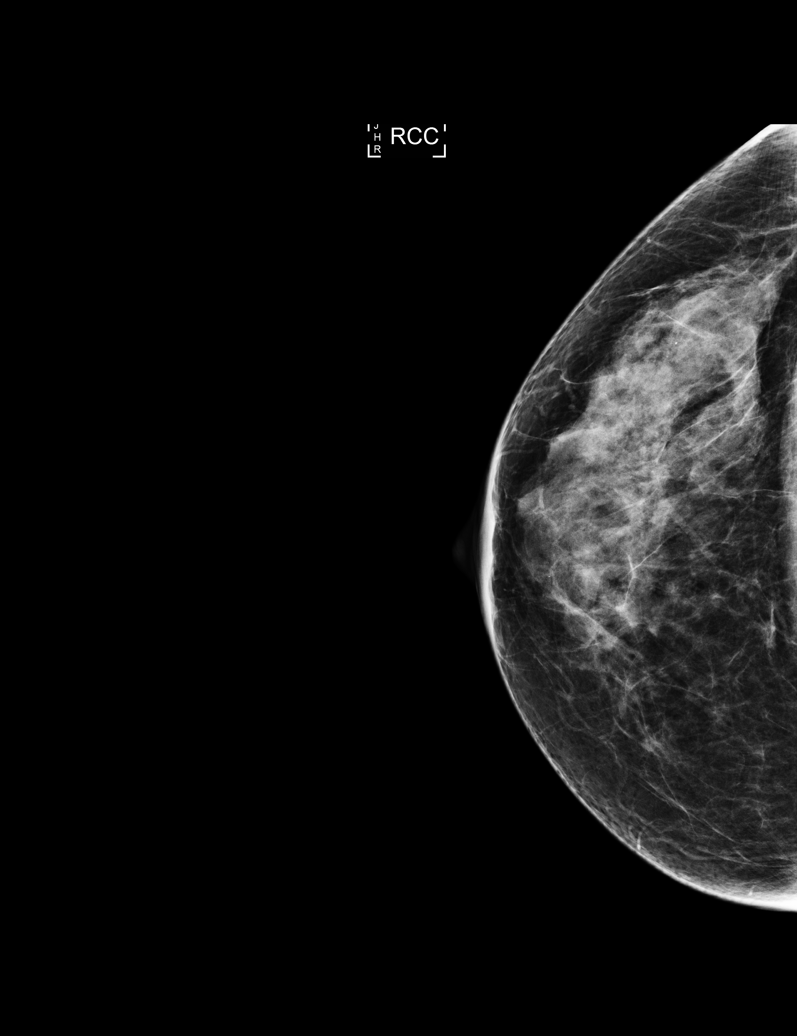

[R MLO synth-2D (1 of 2)]
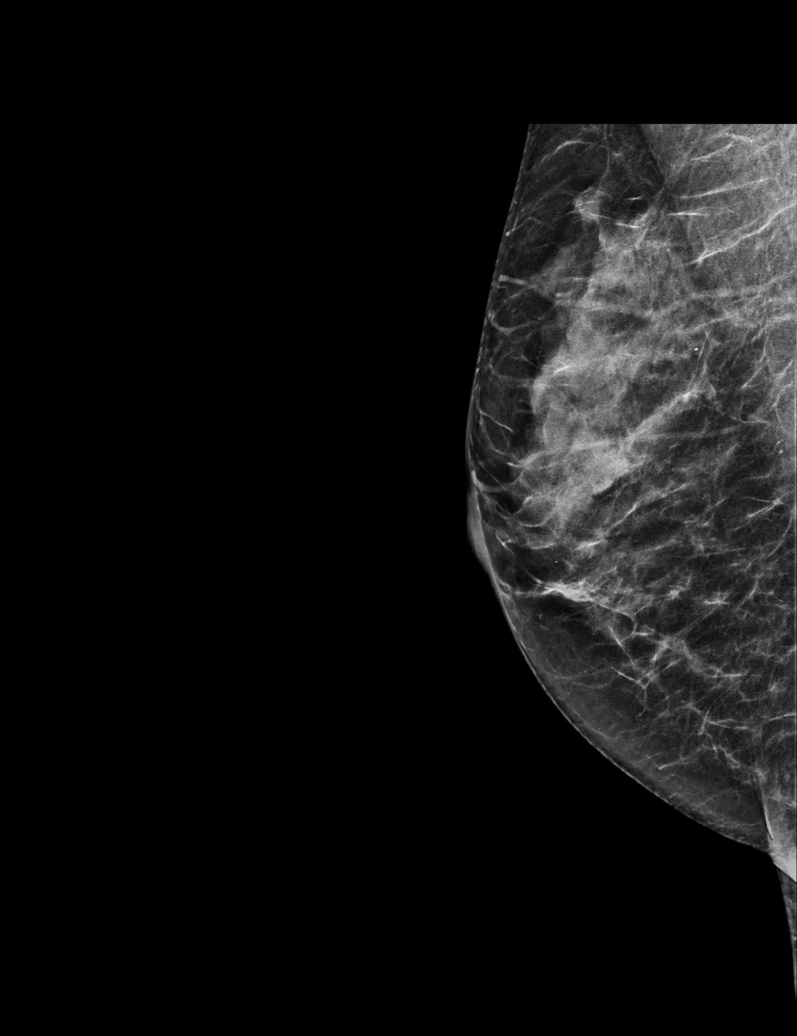

[R MLO synth-2D (2 of 2)]
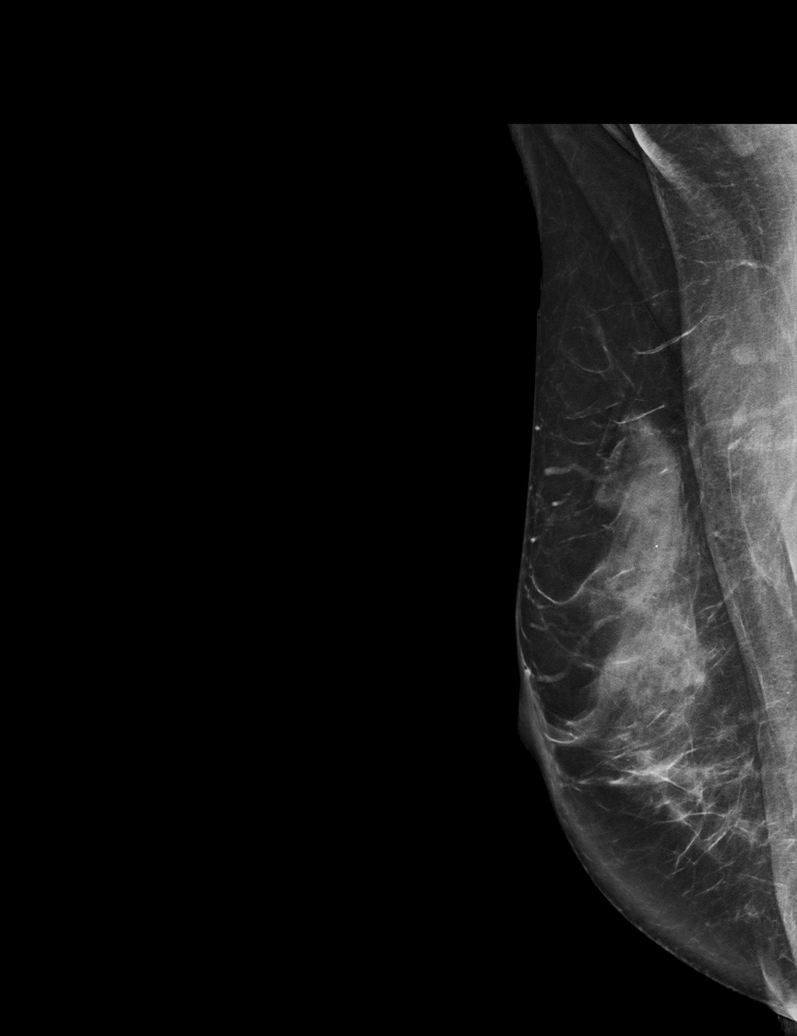

[6 of 21 positions shown; findings below may reference images not displayed]

ACR Breast Density Category c: The breast tissue is heterogeneously
dense, which may obscure small masses.
FINDINGS: CC and MLO views of the right breast including tomography are
performed today. These include a second MLO view with attention to
the far posterior breast and pectoralis muscle. This view
demonstrates multiple benign lymph nodes projecting over the
pectoralis muscle. One of the more anterior lymph nodes seen on
today's images corresponds to the possible asymmetry identified on
recent screening mammogram. No suspicious mass or distortion is
identified in the right breast.

Mammographic images were processed with CAD.
IMPRESSION: No evidence of malignancy. Possible asymmetry identified in the
posterior right breast on the recent screening mammogram is a normal
appearing lymph node.

RECOMMENDATION:
Screening mammogram in one year.(Code:[id number])

I have discussed the findings and recommendations with the patient.
Results were also provided in writing at the conclusion of the
visit. If applicable, a reminder letter will be sent to the patient
regarding the next appointment.

BI-RADS CATEGORY  2: Benign.

## 2019-01-08 ENCOUNTER — Encounter: Payer: Self-pay | Admitting: Family Medicine

## 2019-01-08 ENCOUNTER — Ambulatory Visit (INDEPENDENT_AMBULATORY_CARE_PROVIDER_SITE_OTHER): Payer: BC Managed Care – PPO | Admitting: Family Medicine

## 2019-01-08 ENCOUNTER — Other Ambulatory Visit: Payer: Self-pay

## 2019-01-08 DIAGNOSIS — L255 Unspecified contact dermatitis due to plants, except food: Secondary | ICD-10-CM

## 2019-01-08 MED ORDER — PREDNISONE 20 MG PO TABS: ORAL_TABLET | ORAL | 0 refills | Status: DC

## 2019-01-08 NOTE — Progress Notes (Signed)
Chief Complaint  Patient presents with  . Poison Ivy    both arms since the weekend    Karen Gilmore is a 63 y.o. female here for a skin complaint.  Duration: 3 days Location: forearms Pruritic? Yes Painful? No Drainage? No Exposed to poison oak, has had before.  Sick contacts? No Other associated symptoms: blistering Therapies tried thus far: topical steroids  ROS:  Const: No fevers Skin: As noted in HPI  Past Medical History:  Diagnosis Date  . Constipation   . 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 Rectal bleeding   . Rectal pain    Exam No conversational dyspnea Age appropriate judgment and insight Nml affect and mood Linear clusters of vesicles with erythematous base noted on UE's.   Rhus dermatitis - Plan: predniSONE (DELTASONE) 20 MG tablet  3 week pred taper, 60 mg, 40 mg, and 20 mg/d, each for a week.  F/u prn. The patient voiced understanding and agreement to the plan.  [place], DO 01/08/19 10:23 AM

## 2019-01-14 ENCOUNTER — Encounter: Payer: BC Managed Care – PPO | Admitting: Family Medicine

## 2019-02-05 ENCOUNTER — Other Ambulatory Visit: Payer: Self-pay | Admitting: Obstetrics and Gynecology

## 2019-02-05 DIAGNOSIS — E2839 Other primary ovarian failure: Secondary | ICD-10-CM

## 2019-04-24 ENCOUNTER — Encounter: Payer: BC Managed Care – PPO | Admitting: Family Medicine

## 2019-05-08 ENCOUNTER — Other Ambulatory Visit: Payer: Self-pay

## 2019-05-08 ENCOUNTER — Ambulatory Visit (INDEPENDENT_AMBULATORY_CARE_PROVIDER_SITE_OTHER): Payer: BC Managed Care – PPO | Admitting: Family Medicine

## 2019-05-08 ENCOUNTER — Encounter: Payer: Self-pay | Admitting: Family Medicine

## 2019-05-08 VITALS — Temp 98.0°F | Wt 132.0 lb

## 2019-05-08 DIAGNOSIS — Z0000 Encounter for general adult medical examination without abnormal findings: Secondary | ICD-10-CM | POA: Diagnosis not present

## 2019-05-08 NOTE — Progress Notes (Signed)
Virtual Visit via Video Note  I connected with Karen Gilmore on 05/08/19 at  9:00 AM EDT by a video enabled telemedicine application and verified that I am speaking with the correct person using two identifiers.  Location: Patient: work Provider: home    I discussed the limitations of evaluation and management by telemedicine and the availability of in person appointments. The patient expressed understanding and agreed to proceed.  History of Present Illness:    Past Medical History:  Diagnosis Date  . Constipation   . 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 Rectal bleeding   . Rectal pain    Current Outpatient Medications on File Prior to Visit  Medication Sig Dispense Refill  . cholecalciferol (VITAMIN D) 1000 UNITS tablet Take 1,000 Units by mouth daily.    . fluticasone (FLONASE) 50 MCG/ACT nasal spray Place 2 sprays into both nostrils daily. 1 g 5  . Multiple Vitamin (MULTIVITAMIN) tablet Take 1 tablet by mouth daily.    . Omega-3 Fatty Acids (FISH OIL) 1000 MG CAPS Take 1 capsule by mouth daily.     No current facility-administered medications on file prior to visit.     Social History   Socioeconomic History  . Marital status: Single    Spouse name: Not on file  . Number of children: Not on file  . Years of education: Not on file  . Highest education level: Not on file  Occupational History  . Not on file  Social Needs  . Financial resource strain: Not on file  . Food insecurity    Worry: Not on file    Inability: Not on file  . Transportation needs    Medical: Not on file    Non-medical: Not on file  Tobacco Use  . Smoking status: Never Smoker  . Smokeless tobacco: Never Used  Substance and Sexual Activity  . Alcohol use: Yes    Alcohol/week: 3.0 standard drinks    Types: 3 Glasses of wine per week    Comment:  socially  . Drug use: No  . Sexual activity: Not on file  Lifestyle  . Physical activity    Days per week: Not on file   Minutes per session: Not on file  . Stress: Not on file  Relationships  . Social Herbalist on phone: Not on file    Gets together: Not on file    Attends religious service: Not on file    Active member of club or organization: Not on file    Attends meetings of clubs or organizations: Not on file    Relationship status: Not on file  . Intimate partner violence    Fear of current or ex partner: Not on file    Emotionally abused: Not on file    Physically abused: Not on file    Forced sexual activity: Not on file  Other Topics Concern  . Not on file  Social History Narrative   Exercise- Walk, body pump, tennis   Past Surgical History:  Procedure Laterality Date  . COLONOSCOPY  2006   hemorrhoids;[facility]  . DILATION AND CURETTAGE OF UTERUS  1995  . G 3 P 2    . TUBAL LIGATION     Dr Alden Hipp   Allergies  Allergen Reactions  . Chlorhexidine Gluconate Itching and Rash    Observations/Objective: Vitals:   05/08/19 0900  Temp: 98 F (36.7 C)   No other vitals obtained  Assessment and Plan: 1. Preventative health care ghm utd-- except colon-- -she will check with her ins about cologuard Check labs Pt sees gyn and derm reg Mammogram with gyn imm utd  - CBC with Differential/Platelet; Future - Lipid panel; Future - Comprehensive metabolic panel; Future - TSH; Future   Follow Up Instructions:    I discussed the assessment and treatment plan with the patient. The patient was provided an opportunity to ask questions and all were answered. The patient agreed with the plan and demonstrated an understanding of the instructions.   The patient was advised to call back or seek an in-person evaluation if the symptoms worsen or if the condition fails to improve as anticipated.  I provided 25 minutes of non-face-to-face time during this encounter.   Karen Held, DO

## 2019-05-18 ENCOUNTER — Other Ambulatory Visit: Payer: Self-pay | Admitting: Allergy and Immunology

## 2019-05-21 ENCOUNTER — Telehealth: Payer: Self-pay | Admitting: Family Medicine

## 2019-05-21 ENCOUNTER — Other Ambulatory Visit: Payer: Self-pay | Admitting: Family Medicine

## 2019-05-21 MED ORDER — FLUTICASONE PROPIONATE 50 MCG/ACT NA SUSP: 2.0000 | Freq: Every day | NASAL | 5 refills | Status: DC

## 2019-05-21 NOTE — Telephone Encounter (Signed)
Patient requesting fluticasone (FLONASE) 50 MCG/ACT nasal spray  Informed patient please allow 48 to 72 hour turn around time. Patient CPE with PCP was 05/08/2019   [id number] - [place], [address] AT [place]

## 2019-05-21 NOTE — Telephone Encounter (Signed)
renewed

## 2019-05-22 NOTE — Telephone Encounter (Signed)
Patient notified that medication was sent in.

## 2019-05-29 ENCOUNTER — Other Ambulatory Visit: Payer: Self-pay

## 2019-05-29 ENCOUNTER — Other Ambulatory Visit (INDEPENDENT_AMBULATORY_CARE_PROVIDER_SITE_OTHER): Payer: BC Managed Care – PPO

## 2019-05-29 DIAGNOSIS — Z0000 Encounter for general adult medical examination without abnormal findings: Secondary | ICD-10-CM | POA: Diagnosis not present

## 2019-05-29 LAB — CBC WITH DIFFERENTIAL/PLATELET
Basophils Absolute: 0.1 10*3/uL (ref 0.0–0.1)
Basophils Relative: 1 % (ref 0.0–3.0)
Eosinophils Absolute: 0.3 10*3/uL (ref 0.0–0.7)
Eosinophils Relative: 5.4 % — ABNORMAL HIGH (ref 0.0–5.0)
HCT: 42.5 % (ref 36.0–46.0)
Hemoglobin: 14.2 g/dL (ref 12.0–15.0)
Lymphocytes Relative: 42.1 % (ref 12.0–46.0)
Lymphs Abs: 2 10*3/uL (ref 0.7–4.0)
MCHC: 33.5 g/dL (ref 30.0–36.0)
MCV: 95.9 fl (ref 78.0–100.0)
Monocytes Absolute: 0.4 10*3/uL (ref 0.1–1.0)
Monocytes Relative: 7.4 % (ref 3.0–12.0)
Neutro Abs: 2.1 10*3/uL (ref 1.4–7.7)
Neutrophils Relative %: 44.1 % (ref 43.0–77.0)
Platelets: 207 10*3/uL (ref 150.0–400.0)
RBC: 4.44 Mil/uL (ref 3.87–5.11)
RDW: 13.2 % (ref 11.5–15.5)
WBC: 4.8 10*3/uL (ref 4.0–10.5)

## 2019-05-29 LAB — COMPREHENSIVE METABOLIC PANEL
ALT: 16 U/L (ref 0–35)
AST: 18 U/L (ref 0–37)
Albumin: 4.4 g/dL (ref 3.5–5.2)
Alkaline Phosphatase: 81 U/L (ref 39–117)
BUN: 17 mg/dL (ref 6–23)
CO2: 31 mEq/L (ref 19–32)
Calcium: 9.3 mg/dL (ref 8.4–10.5)
Chloride: 103 mEq/L (ref 96–112)
Creatinine, Ser: 0.76 mg/dL (ref 0.40–1.20)
GFR: 77.19 mL/min (ref >60.00)
Glucose, Bld: 94 mg/dL (ref 70–99)
Potassium: 4.2 mEq/L (ref 3.5–5.1)
Sodium: 139 mEq/L (ref 135–145)
Total Bilirubin: 0.6 mg/dL (ref 0.2–1.2)
Total Protein: 6.4 g/dL (ref 6.0–8.3)

## 2019-05-29 LAB — LIPID PANEL
Cholesterol: 202 mg/dL — ABNORMAL HIGH (ref 0–200)
HDL: 64.9 mg/dL (ref >39.00)
LDL Cholesterol: 121 mg/dL — ABNORMAL HIGH (ref 0–99)
NonHDL: 137.15
Total CHOL/HDL Ratio: 3
Triglycerides: 83 mg/dL (ref 0.0–149.0)
VLDL: 16.6 mg/dL (ref 0.0–40.0)

## 2019-05-29 LAB — TSH: TSH: 1.88 u[IU]/mL (ref 0.35–4.50)

## 2019-09-14 ENCOUNTER — Ambulatory Visit: Payer: BC Managed Care – PPO | Attending: Internal Medicine

## 2019-09-14 DIAGNOSIS — Z23 Encounter for immunization: Secondary | ICD-10-CM | POA: Insufficient documentation

## 2019-09-14 NOTE — Progress Notes (Signed)
   Covid-19 Vaccination Clinic  Name:  Karen Gilmore    MRN: [id number] DOB: 11-15-1955  09/14/2019  [name] was observed post Covid-19 immunization for 15 minutes without incidence. She was provided with Vaccine Information Sheet and instruction to access the V-Safe system.   [name] was instructed to call 911 with any severe reactions post vaccine: Marland Kitchen Difficulty breathing  . Swelling of your face and throat  . A fast heartbeat  . A bad rash all over your body  . Dizziness and weakness    Immunizations Administered    Name Date Dose VIS Date Route   Pfizer COVID-19 Vaccine 09/14/2019  4:34 PM 0.3 mL 06/28/2019 Intramuscular   Manufacturer: [place]   Lot: [id number]   [place]: [id number]

## 2019-09-25 ENCOUNTER — Telehealth: Payer: Self-pay | Admitting: Family Medicine

## 2019-09-25 NOTE — Telephone Encounter (Signed)
Patient states that she is ready to order cologaurd and it needed to be code as preventative .

## 2019-09-26 NOTE — Telephone Encounter (Signed)
Cologuard ordered via portal.

## 2019-10-05 ENCOUNTER — Ambulatory Visit: Payer: BC Managed Care – PPO | Attending: Internal Medicine

## 2019-10-05 DIAGNOSIS — Z23 Encounter for immunization: Secondary | ICD-10-CM

## 2019-10-05 NOTE — Progress Notes (Signed)
   Covid-19 Vaccination Clinic  Name:  LASHANTI SKARZYNSKI    MRN: [id number] DOB: 01-26-1956  10/05/2019  Ms. Scaffidi was observed post Covid-19 immunization for 15 minutes without incident. She was provided with Vaccine Information Sheet and instruction to access the V-Safe system.   Ms. Atayde was instructed to call 911 with any severe reactions post vaccine: Marland Kitchen Difficulty breathing  . Swelling of face and throat  . A fast heartbeat  . A bad rash all over body  . Dizziness and weakness   Immunizations Administered    Name Date Dose VIS Date Route   Pfizer COVID-19 Vaccine 10/05/2019  8:46 AM 0.3 mL 06/28/2019 Intramuscular   Manufacturer: [place]   Lot: [id number]   [place]: [id number]

## 2019-10-11 LAB — COLOGUARD: COLOGUARD: NEGATIVE

## 2019-11-08 ENCOUNTER — Telehealth: Payer: Self-pay

## 2019-11-08 NOTE — Telephone Encounter (Signed)
Patient called in to see if DR. Lowne or the nurse could give her a call to discuss her Color Guard results. Please give the patient a call at [phone]

## 2019-11-08 NOTE — Telephone Encounter (Signed)
Pt made aware of negative results of Cologuard

## 2020-05-28 ENCOUNTER — Other Ambulatory Visit: Payer: Self-pay

## 2020-05-28 ENCOUNTER — Encounter: Payer: Self-pay | Admitting: Family Medicine

## 2020-05-28 ENCOUNTER — Ambulatory Visit (INDEPENDENT_AMBULATORY_CARE_PROVIDER_SITE_OTHER): Payer: BC Managed Care – PPO | Admitting: Family Medicine

## 2020-05-28 VITALS — BP 120/70 | HR 64 | Temp 98.6°F | Resp 18 | Ht 64.5 in | Wt 134.0 lb

## 2020-05-28 DIAGNOSIS — Z832 Family history of diseases of the blood and blood-forming organs and certain disorders involving the immune mechanism: Secondary | ICD-10-CM

## 2020-05-28 DIAGNOSIS — Z0000 Encounter for general adult medical examination without abnormal findings: Secondary | ICD-10-CM | POA: Diagnosis not present

## 2020-05-28 DIAGNOSIS — Z23 Encounter for immunization: Secondary | ICD-10-CM | POA: Diagnosis not present

## 2020-05-28 NOTE — Progress Notes (Signed)
Subjective:     Karen Gilmore is a 64 y.o. female and is here for a comprehensive physical exam. 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 Marital status: Single    Spouse name: Not on file  . Number of children: Not on file  . Years of education: Not on file  . Highest education level: Not on file  Occupational History  . Not on file  Tobacco Use  . Smoking status: Never Smoker  . Smokeless tobacco: Never Used  Vaping Use  . Vaping Use: Never used  Substance and Sexual Activity  . Alcohol use: Yes    Alcohol/week: 3.0 standard drinks    Types: 3 Glasses of wine per week    Comment:  socially  . Drug use: No  . Sexual activity: Not on file  Other Topics Concern  . Not on file  Social History Narrative   Exercise- Walk, body pump, tennis   Social Determinants of Health   Financial Resource Strain:   . Difficulty of Paying Living Expenses: Not on file  Food Insecurity:   . Worried About Charity fundraiser in the Last Year: Not on file  . Ran Out of Food in the Last Year: Not on file  Transportation Needs:   . Lack of Transportation (Medical): Not on file  . Lack of Transportation (Non-Medical): Not on file  Physical Activity:   . Days of Exercise per Week: Not on file  . Minutes of Exercise per Session: Not on file  Stress:   . Feeling of Stress : Not on file  Social Connections:   . Frequency of Communication with Friends and Family: Not on file  . Frequency of Social Gatherings with Friends and Family: Not on file  . Attends Religious Services: Not on file  . Active Member  of Clubs or Organizations: Not on file  . Attends Archivist Meetings: Not on file  . Marital Status: Not on file  Intimate Partner Violence:   . Fear of Current or Ex-Partner: Not on file  . Emotionally Abused: Not on file  . Physically Abused: Not on file  . Sexually Abused: Not on file   Health Maintenance  Topic Date Due  . Fecal DNA (Cologuard)  02/25/2019  . PAP SMEAR-Modifier  01/05/2020  . INFLUENZA VACCINE  02/16/2020  . MAMMOGRAM  03/18/2021  . TETANUS/TDAP  06/13/2023  . COVID-19 Vaccine  Completed  . Hepatitis C Screening  Completed  . HIV Screening  Completed    The following portions of the patient's history were reviewed and updated as appropriate:  She  has a past medical history of Constipation, Mitral click-murmur syndrome, Rectal bleeding, and Rectal pain. She does not have any pertinent problems on file. She  has a past surgical history that includes G 3 P 2; Tubal ligation; Colonoscopy (2006); and Dilation and curettage of uterus (1995). Her family history includes Breast cancer (age of onset: 58) in her mother; Heart attack  in her paternal grandfather; Heart attack (age of onset: 37) in her father; Heart disease in her father; Hyperlipidemia in her father. She  reports that she has never smoked. She has never used smokeless tobacco. She reports current alcohol use of about 3.0 standard drinks of alcohol per week. She reports that she does not use drugs. She has a current medication list which includes the following prescription(s): cholecalciferol, fluticasone, multivitamin, and fish oil. Current Outpatient Medications on File Prior to Visit  Medication Sig Dispense Refill  . cholecalciferol (VITAMIN D) 1000 UNITS tablet Take 1,000 Units by mouth daily.    . fluticasone (FLONASE) 50 MCG/ACT nasal spray Place 2 sprays into both nostrils daily. 1 g 5  . Multiple Vitamin (MULTIVITAMIN) tablet Take 1 tablet by mouth daily.    . Omega-3 Fatty Acids (FISH  OIL) 1000 MG CAPS Take 1 capsule by mouth daily.     No current facility-administered medications on file prior to visit.   She is allergic to chlorhexidine gluconate..  Review of Systems Review of Systems  Constitutional: Negative for activity change, appetite change and fatigue.  HENT: Negative for hearing loss, congestion, tinnitus and ear discharge.  dentist q77m Eyes: Negative for visual disturbance (see optho q1y -- vision corrected to 20/20 with glasses).  Respiratory: Negative for cough, chest tightness and shortness of breath.   Cardiovascular: Negative for chest pain, palpitations and leg swelling.  Gastrointestinal: Negative for abdominal pain, diarrhea, constipation and abdominal distention.  Genitourinary: Negative for urgency, frequency, decreased urine volume and difficulty urinating.  Musculoskeletal: Negative for back pain, arthralgias and gait problem.  Skin: Negative for color change, pallor and rash.  Neurological: Negative for dizziness, light-headedness, numbness and headaches.  Hematological: Negative for adenopathy. Does not bruise/bleed easily.  Psychiatric/Behavioral: Negative for suicidal ideas, confusion, sleep disturbance, self-injury, dysphoric mood, decreased concentration and agitation.       Objective:    BP 120/70 (BP Location: Right Arm, Patient Position: Sitting, Cuff Size: Normal)   Pulse 64   Temp 98.6 F (37 C) (Oral)   Resp 18   Ht 5' 4.5" (1.638 m)   Wt 134 lb (60.8 kg)   SpO2 99%   BMI 22.65 kg/m  General appearance: alert, cooperative, appears stated age and no distress Head: Normocephalic, without obvious abnormality, atraumatic Eyes: negative findings: lids and lashes normal, conjunctivae and sclerae normal and pupils equal, round, reactive to light and accomodation Ears: normal TM's and external ear canals both ears Neck: no adenopathy, no carotid bruit, no JVD, supple, symmetrical, trachea midline and thyroid not enlarged,  symmetric, no tenderness/mass/nodules Back: symmetric, no curvature. ROM normal. No CVA tenderness. Lungs: clear to auscultation bilaterally Breasts: gyn Heart: regular rate and rhythm, S1, S2 normal, no murmur, click, rub or gallop Abdomen: soft, non-tender; bowel sounds normal; no masses,  no organomegaly Pelvic: deferred--gyn Extremities: extremities normal, atraumatic, no cyanosis or edema  L foot-- + small bunion , nontender Pulses: 2+ and symmetric Skin: Skin color, texture, turgor normal. No rashes or lesions Lymph nodes: Cervical, supraclavicular, and axillary nodes normal. Neurologic: Alert and oriented X 3, normal strength and tone. Normal symmetric reflexes. Normal coordination and gait    Assessment:    Healthy female exam.      Plan:    ghm utd Check labs  See After Visit Summary for Counseling Recommendations    1. Need for influenza vaccination   - Flu Vaccine QUAD 36+ mos IM  2. Preventative health care See above  -  Lipid panel; Future - CBC with Differential/Platelet; Future - TSH; Future - Comprehensive metabolic panel; Future  3. Family history of factor V deficiency Check labs  - Factor 5 assay; Future

## 2020-05-28 NOTE — Patient Instructions (Signed)

## 2020-06-09 ENCOUNTER — Other Ambulatory Visit (INDEPENDENT_AMBULATORY_CARE_PROVIDER_SITE_OTHER): Payer: BC Managed Care – PPO

## 2020-06-09 ENCOUNTER — Other Ambulatory Visit: Payer: Self-pay

## 2020-06-09 DIAGNOSIS — Z832 Family history of diseases of the blood and blood-forming organs and certain disorders involving the immune mechanism: Secondary | ICD-10-CM

## 2020-06-09 DIAGNOSIS — Z0000 Encounter for general adult medical examination without abnormal findings: Secondary | ICD-10-CM | POA: Diagnosis not present

## 2020-06-09 LAB — CBC WITH DIFFERENTIAL/PLATELET
Basophils Absolute: 0 10*3/uL (ref 0.0–0.1)
Basophils Relative: 0.9 % (ref 0.0–3.0)
Eosinophils Absolute: 0.3 10*3/uL (ref 0.0–0.7)
Eosinophils Relative: 6 % — ABNORMAL HIGH (ref 0.0–5.0)
HCT: 43.3 % (ref 36.0–46.0)
Hemoglobin: 14.3 g/dL (ref 12.0–15.0)
Lymphocytes Relative: 40.9 % (ref 12.0–46.0)
Lymphs Abs: 1.9 10*3/uL (ref 0.7–4.0)
MCHC: 33 g/dL (ref 30.0–36.0)
MCV: 93.9 fl (ref 78.0–100.0)
Monocytes Absolute: 0.3 10*3/uL (ref 0.1–1.0)
Monocytes Relative: 7 % (ref 3.0–12.0)
Neutro Abs: 2.1 10*3/uL (ref 1.4–7.7)
Neutrophils Relative %: 45.2 % (ref 43.0–77.0)
Platelets: 225 10*3/uL (ref 150.0–400.0)
RBC: 4.61 Mil/uL (ref 3.87–5.11)
RDW: 13.3 % (ref 11.5–15.5)
WBC: 4.6 10*3/uL (ref 4.0–10.5)

## 2020-06-09 LAB — LIPID PANEL
Cholesterol: 214 mg/dL — ABNORMAL HIGH (ref 0–200)
HDL: 76.5 mg/dL (ref >39.00)
LDL Cholesterol: 121 mg/dL — ABNORMAL HIGH (ref 0–99)
NonHDL: 137.17
Total CHOL/HDL Ratio: 3
Triglycerides: 83 mg/dL (ref 0.0–149.0)
VLDL: 16.6 mg/dL (ref 0.0–40.0)

## 2020-06-09 LAB — COMPREHENSIVE METABOLIC PANEL
ALT: 15 U/L (ref 0–35)
AST: 17 U/L (ref 0–37)
Albumin: 4.3 g/dL (ref 3.5–5.2)
Alkaline Phosphatase: 94 U/L (ref 39–117)
BUN: 18 mg/dL (ref 6–23)
CO2: 32 mEq/L (ref 19–32)
Calcium: 9.5 mg/dL (ref 8.4–10.5)
Chloride: 101 mEq/L (ref 96–112)
Creatinine, Ser: 0.71 mg/dL (ref 0.40–1.20)
GFR: 90.93 mL/min (ref >60.00)
Glucose, Bld: 86 mg/dL (ref 70–99)
Potassium: 4 mEq/L (ref 3.5–5.1)
Sodium: 139 mEq/L (ref 135–145)
Total Bilirubin: 0.6 mg/dL (ref 0.2–1.2)
Total Protein: 6.7 g/dL (ref 6.0–8.3)

## 2020-06-09 LAB — TSH: TSH: 1.88 u[IU]/mL (ref 0.35–4.50)

## 2020-06-13 LAB — FACTOR 5 ASSAY: COAG FACTOR V ACTIVITY: 147 % normal (ref 65–150)

## 2020-09-30 ENCOUNTER — Ambulatory Visit: Payer: BC Managed Care – PPO | Admitting: Dermatology

## 2020-10-04 ENCOUNTER — Other Ambulatory Visit: Payer: Self-pay | Admitting: Family Medicine

## 2020-11-18 ENCOUNTER — Other Ambulatory Visit: Payer: Self-pay

## 2020-11-18 ENCOUNTER — Encounter: Payer: Self-pay | Admitting: Dermatology

## 2020-11-18 ENCOUNTER — Ambulatory Visit: Payer: BC Managed Care – PPO | Admitting: Dermatology

## 2020-11-18 DIAGNOSIS — D1801 Hemangioma of skin and subcutaneous tissue: Secondary | ICD-10-CM | POA: Diagnosis not present

## 2020-11-18 DIAGNOSIS — L309 Dermatitis, unspecified: Secondary | ICD-10-CM

## 2020-11-18 DIAGNOSIS — L719 Rosacea, unspecified: Secondary | ICD-10-CM

## 2020-11-18 MED ORDER — TRIAMCINOLONE ACETONIDE 0.1 % EX CREA: 1.0000 "application " | TOPICAL_CREAM | Freq: Every day | CUTANEOUS | 11 refills | Status: DC | PRN

## 2020-11-18 MED ORDER — IVERMECTIN 1 % EX CREA: 1.0000 "application " | TOPICAL_CREAM | Freq: Every day | CUTANEOUS | 2 refills | Status: DC

## 2020-11-18 NOTE — Patient Instructions (Signed)
Call in 4-6 weeks with update

## 2020-11-23 ENCOUNTER — Telehealth: Payer: Self-pay

## 2020-11-23 NOTE — Telephone Encounter (Signed)
Tayte Snook Key: [id number] - PA Case ID: [id number] - Rx #: [id number] Need help? Call us at [phone] Outcome Approvedtoday Your PA request has been approved. Additional information will be provided in the approval communication. (Message 1145) Drug Ivermectin 1% cream Form [employer] Electronic PA Form [phone] NCPDP) Original Claim Info [place] REQ-MD CALL [phone].DRUG REQUIRES PRIOR AUTHORIZATION

## 2020-11-23 NOTE — Telephone Encounter (Signed)
Karen Gilmore Key: [id number] - PA Case ID: [id number] - Rx #: [id number] Need help? Call us at [phone] Status Additional Information Required Drug Ivermectin 1% cream Form [employer] Electronic PA Form [phone] NCPDP) Original Claim Info [place] REQ-MD CALL [id number].DRUG REQUIRES PRIOR AUTHORIZATION

## 2020-11-29 ENCOUNTER — Encounter: Payer: Self-pay | Admitting: Dermatology

## 2020-11-29 NOTE — Progress Notes (Signed)
   Follow-Up Visit   Subjective  Karen Gilmore is a 65 y.o. female who presents for the following: Skin Problem (Chronic condition on nose- rough and scaly/Rash that comes and goes- works well with Triamcinolone 0.1 cream- needs refill).  Right Face on Periodic Itchy Rashes on Body Location:  Duration:  Quality:  Associated Signs/Symptoms: Modifying Factors:  Severity:  Timing: Context:   Objective  Well appearing patient in no apparent distress; mood and affect are within normal limits. Objective  Head - Anterior (Face): Centrifacial erythema without papules/pustules.   Objective  Left Abdomen (side) - Upper: Many tiny smooth red lesions   Objective  Chest - Medial [facility]): History of recurring rash is which routinely respond to triamcinolone.    A focused examination was performed including Head, neck, arms, back, upper chest.. Relevant physical exam findings are noted in the Assessment and Plan.   Assessment & Plan    Rosacea Head - Anterior (Face)  Discussed in some detail the endogenous nature of rosacea along with possible exacerbation by the COVID-19 mask and other exogenous factors.  If out-of-pocket expense is acceptable, she will be given treatment with cilantro daily for 6 weeks.  Follow-up by MyChart at that time.  Ivermectin (SOOLANTRA) 1 % CREA - Head - Anterior (Face)  Hemangioma of skin Left Abdomen (side) - Upper  No intervention necessary.  Eczema, unspecified type Chest - Medial [facility])  Okay refills of triamcinolone cream.  Reminded to avoid use on face and body folds.  triamcinolone cream (KENALOG) 0.1 % - Chest - Medial [facility])      I, Lavonna Monarch, MD, have reviewed all documentation for this visit.  The documentation on 11/29/20 for the exam, diagnosis, procedures, and orders are all accurate and complete.

## 2021-05-28 ENCOUNTER — Encounter: Payer: BC Managed Care – PPO | Admitting: Family Medicine

## 2021-06-01 ENCOUNTER — Other Ambulatory Visit: Payer: Self-pay | Admitting: Obstetrics and Gynecology

## 2021-06-01 DIAGNOSIS — E2839 Other primary ovarian failure: Secondary | ICD-10-CM

## 2021-06-02 ENCOUNTER — Other Ambulatory Visit: Payer: Self-pay

## 2021-06-03 ENCOUNTER — Ambulatory Visit (INDEPENDENT_AMBULATORY_CARE_PROVIDER_SITE_OTHER): Payer: BC Managed Care – PPO | Admitting: Family Medicine

## 2021-06-03 ENCOUNTER — Encounter: Payer: Self-pay | Admitting: Family Medicine

## 2021-06-03 VITALS — BP 122/82 | HR 68 | Temp 98.5°F | Resp 18 | Ht 64.5 in | Wt 138.2 lb

## 2021-06-03 DIAGNOSIS — Z0000 Encounter for general adult medical examination without abnormal findings: Secondary | ICD-10-CM

## 2021-06-03 DIAGNOSIS — Z23 Encounter for immunization: Secondary | ICD-10-CM

## 2021-06-03 DIAGNOSIS — E559 Vitamin D deficiency, unspecified: Secondary | ICD-10-CM

## 2021-06-03 LAB — TSH: TSH: 1.68 u[IU]/mL (ref 0.35–5.50)

## 2021-06-03 LAB — CBC WITH DIFFERENTIAL/PLATELET
Basophils Absolute: 0 10*3/uL (ref 0.0–0.1)
Basophils Relative: 0.5 % (ref 0.0–3.0)
Eosinophils Absolute: 0.3 10*3/uL (ref 0.0–0.7)
Eosinophils Relative: 3.6 % (ref 0.0–5.0)
HCT: 42.9 % (ref 36.0–46.0)
Hemoglobin: 14.2 g/dL (ref 12.0–15.0)
Lymphocytes Relative: 26.1 % (ref 12.0–46.0)
Lymphs Abs: 1.9 10*3/uL (ref 0.7–4.0)
MCHC: 33 g/dL (ref 30.0–36.0)
MCV: 94.2 fl (ref 78.0–100.0)
Monocytes Absolute: 0.6 10*3/uL (ref 0.1–1.0)
Monocytes Relative: 7.6 % (ref 3.0–12.0)
Neutro Abs: 4.5 10*3/uL (ref 1.4–7.7)
Neutrophils Relative %: 62.2 % (ref 43.0–77.0)
Platelets: 238 10*3/uL (ref 150.0–400.0)
RBC: 4.55 Mil/uL (ref 3.87–5.11)
RDW: 13.3 % (ref 11.5–15.5)
WBC: 7.2 10*3/uL (ref 4.0–10.5)

## 2021-06-03 LAB — LIPID PANEL
Cholesterol: 244 mg/dL — ABNORMAL HIGH (ref 0–200)
HDL: 74.9 mg/dL (ref >39.00)
LDL Cholesterol: 155 mg/dL — ABNORMAL HIGH (ref 0–99)
NonHDL: 169.57
Total CHOL/HDL Ratio: 3
Triglycerides: 75 mg/dL (ref 0.0–149.0)
VLDL: 15 mg/dL (ref 0.0–40.0)

## 2021-06-03 LAB — COMPREHENSIVE METABOLIC PANEL
ALT: 17 U/L (ref 0–35)
AST: 18 U/L (ref 0–37)
Albumin: 4.4 g/dL (ref 3.5–5.2)
Alkaline Phosphatase: 102 U/L (ref 39–117)
BUN: 19 mg/dL (ref 6–23)
CO2: 31 mEq/L (ref 19–32)
Calcium: 9.5 mg/dL (ref 8.4–10.5)
Chloride: 100 mEq/L (ref 96–112)
Creatinine, Ser: 0.76 mg/dL (ref 0.40–1.20)
GFR: 83.22 mL/min (ref >60.00)
Glucose, Bld: 83 mg/dL (ref 70–99)
Potassium: 4.3 mEq/L (ref 3.5–5.1)
Sodium: 138 mEq/L (ref 135–145)
Total Bilirubin: 0.7 mg/dL (ref 0.2–1.2)
Total Protein: 6.9 g/dL (ref 6.0–8.3)

## 2021-06-03 LAB — VITAMIN D 25 HYDROXY (VIT D DEFICIENCY, FRACTURES): VITD: 25.26 ng/mL — ABNORMAL LOW (ref 30.00–100.00)

## 2021-06-03 NOTE — Patient Instructions (Signed)
Preventive Care 67-63 Years Old, Female Preventive care refers to lifestyle choices and visits with your health care provider that can promote health and wellness. Preventive care visits are also called wellness exams. What can I expect for my preventive care visit? Counseling Your health care provider may ask you questions about your: Medical history, including: Past medical problems. Family medical history. Pregnancy history. Current health, including: Menstrual cycle. Method of birth control. Emotional well-being. Home life and relationship well-being. Sexual activity and sexual health. Lifestyle, including: Alcohol, nicotine or tobacco, and drug use. Access to firearms. Diet, exercise, and sleep habits. Work and work Statistician. Sunscreen use. Safety issues such as seatbelt and bike helmet use. Physical exam Your health care provider will check your: Height and weight. These may be used to calculate your BMI (body mass index). BMI is a measurement that tells if you are at a healthy weight. Waist circumference. This measures the distance around your waistline. This measurement also tells if you are at a healthy weight and may help predict your risk of certain diseases, such as type 2 diabetes and high blood pressure. Heart rate and blood pressure. Body temperature. Skin for abnormal spots. What immunizations do I need? Vaccines are usually given at various ages, according to a schedule. Your health care provider will recommend vaccines for you based on your age, medical history, and lifestyle or other factors, such as travel or where you work. What tests do I need? Screening Your health care provider may recommend screening tests for certain conditions. This may include: Lipid and cholesterol levels. Diabetes screening. This is done by checking your blood sugar (glucose) after you have not eaten for a while (fasting). Pelvic exam and Pap test. Hepatitis B test. Hepatitis C  test. HIV (human immunodeficiency virus) test. STI (sexually transmitted infection) testing, if you are at risk. Lung cancer screening. Colorectal cancer screening. Mammogram. Talk with your health care provider about when you should start having regular mammograms. This may depend on whether you have a family history of breast cancer. BRCA-related cancer screening. This may be done if you have a family history of breast, ovarian, tubal, or peritoneal cancers. Bone density scan. This is done to screen for osteoporosis. Talk with your health care provider about your test results, treatment options, and if necessary, the need for more tests. Follow these instructions at home: Eating and drinking  Eat a diet that includes fresh fruits and vegetables, whole grains, lean protein, and low-fat dairy products. Take vitamin and mineral supplements as recommended by your health care provider. Do not drink alcohol if: Your health care provider tells you not to drink. You are pregnant, may be pregnant, or are planning to become pregnant. If you drink alcohol: Limit how much you have to 0-1 drink a day. Know how much alcohol is in your drink. In the [place] one drink equals one 12 oz bottle of beer (355 mL), one 5 oz glass of wine (148 mL), or one 1 oz glass of hard liquor (44 mL). Lifestyle Brush your teeth every morning and night with fluoride toothpaste. Floss one time each day. Exercise for at least 30 minutes 5 or more days each week. Do not use any products that contain nicotine or tobacco. These products include cigarettes, chewing tobacco, and vaping devices, such as e-cigarettes. If you need help quitting, ask your health care provider. Do not use drugs. If you are sexually active, practice safe sex. Use a condom or other form of protection to prevent  STIs. If you do not wish to become pregnant, use a form of birth control. If you plan to become pregnant, see your health care provider for a  prepregnancy visit. Take aspirin only as told by your health care provider. Make sure that you understand how much to take and what form to take. Work with your health care provider to find out whether it is safe and beneficial for you to take aspirin daily. Find healthy ways to manage stress, such as: Meditation, yoga, or listening to music. Journaling. Talking to a trusted person. Spending time with friends and family. Minimize exposure to UV radiation to reduce your risk of skin cancer. Safety Always wear your seat belt while driving or riding in a vehicle. Do not drive: If you have been drinking alcohol. Do not ride with someone who has been drinking. When you are tired or distracted. While texting. If you have been using any mind-altering substances or drugs. Wear a helmet and other protective equipment during sports activities. If you have firearms in your house, make sure you follow all gun safety procedures. Seek help if you have been physically or sexually abused. What's next? Visit your health care provider once a year for an annual wellness visit. Ask your health care provider how often you should have your eyes and teeth checked. Stay up to date on all vaccines. This information is not intended to replace advice given to you by your health care provider. Make sure you discuss any questions you have with your health care provider. Document Revised: 12/30/2020 Document Reviewed: 12/30/2020 Elsevier Patient Education  [place].

## 2021-06-03 NOTE — Progress Notes (Signed)
Subjective:     Karen Gilmore is a 65 y.o. female and is here for a comprehensive physical exam. The patient reports no problems.  Social History   Socioeconomic History   Marital status: Single    Spouse name: Not on file   Number of children: Not on file   Years of education: Not on file   Highest education level: Not on file  Occupational History   Occupation: teacher  Tobacco Use   Smoking status: Never   Smokeless tobacco: Never  Vaping Use   Vaping Use: Never used  Substance and Sexual Activity   Alcohol use: Yes    Alcohol/week: 3.0 standard drinks    Types: 3 Glasses of wine per week    Comment:  socially   Drug use: No   Sexual activity: Not on file  Other Topics Concern   Not on file  Social History Narrative   Exercise- Walk, body pump, tennis   Social Determinants of Health   Financial Resource Strain: Not on file  Food Insecurity: Not on file  Transportation Needs: Not on file  Physical Activity: Not on file  Stress: Not on file  Social Connections: Not on file  Intimate Partner Violence: Not on file   Health Maintenance  Topic Date Due   COVID-19 Vaccine (5 - Booster for Pfizer series) 03/05/2021   PAP SMEAR-Modifier  01/04/2022   MAMMOGRAM  05/25/2022   Fecal DNA (Cologuard)  11/06/2022   TETANUS/TDAP  06/13/2023   INFLUENZA VACCINE  Completed   Hepatitis C Screening  Completed   HIV Screening  Completed   Zoster Vaccines- Shingrix  Completed   Pneumococcal Vaccine 46-57 Years old  Aged Out   HPV VACCINES  Aged Out    The following portions of the patient's history were reviewed and updated as appropriate: She  has a past medical history of Constipation, Mitral click-murmur syndrome, Rectal bleeding, and Rectal pain. She does not have any pertinent problems on file. She  has a past surgical history that includes G 3 P 2; Tubal ligation; Colonoscopy (2006); and Dilation and curettage of uterus (1995). Her family history includes Breast cancer  (age of onset: 59) in her mother; Factor V Leiden deficiency in her nephew, niece, niece, and sister; Heart attack in her paternal grandfather; Heart attack (age of onset: 34) in her father; Heart disease in her father; Hyperlipidemia in her father. She  reports that she has never smoked. She has never used smokeless tobacco. She reports current alcohol use of about 3.0 standard drinks per week. She reports that she does not use drugs. She has a current medication list which includes the following prescription(s): cholecalciferol, fluticasone, ivermectin, multivitamin, fish oil, and triamcinolone cream. Current Outpatient Medications on File Prior to Visit  Medication Sig Dispense Refill   cholecalciferol (VITAMIN D) 1000 UNITS tablet Take 1,000 Units by mouth daily.     fluticasone (FLONASE) 50 MCG/ACT nasal spray Place 2 sprays into both nostrils daily. 16 g 5   Ivermectin (SOOLANTRA) 1 % CREA Apply 1 application topically daily. 45 g 2   Multiple Vitamin (MULTIVITAMIN) tablet Take 1 tablet by mouth daily.     Omega-3 Fatty Acids (FISH OIL) 1000 MG CAPS Take 1 capsule by mouth daily.     triamcinolone cream (KENALOG) 0.1 % Apply 1 application topically daily as needed. 453 g 11   No current facility-administered medications on file prior to visit.   She is allergic to chlorhexidine gluconate..  Review  of Systems Review of Systems  Constitutional: Negative for activity change, appetite change and fatigue.  HENT: Negative for hearing loss, congestion, tinnitus and ear discharge.  dentist q27m Eyes: Negative for visual disturbance (see optho q1y -- vision corrected to 20/20 with glasses).  Respiratory: Negative for cough, chest tightness and shortness of breath.   Cardiovascular: Negative for chest pain, palpitations and leg swelling.  Gastrointestinal: Negative for abdominal pain, diarrhea, constipation and abdominal distention.  Genitourinary: Negative for urgency, frequency, decreased urine  volume and difficulty urinating.  Musculoskeletal: Negative for back pain, arthralgias and gait problem.  Skin: Negative for color change, pallor and rash.  Neurological: Negative for dizziness, light-headedness, numbness and headaches.  Hematological: Negative for adenopathy. Does not bruise/bleed easily.  Psychiatric/Behavioral: Negative for suicidal ideas, confusion, sleep disturbance, self-injury, dysphoric mood, decreased concentration and agitation.      Objective:    BP 122/82 (BP Location: Left Arm, Patient Position: Sitting, Cuff Size: Normal)   Pulse 68   Temp 98.5 F (36.9 C) (Oral)   Resp 18   Ht 5' 4.5" (1.638 m)   Wt 138 lb 3.2 oz (62.7 kg)   SpO2 98%   BMI 23.36 kg/m  General appearance: alert, cooperative, appears stated age, and no distress Head: Normocephalic, without obvious abnormality, atraumatic Eyes: conjunctivae/corneas clear. PERRL, EOM's intact. Fundi benign. Ears: normal TM's and external ear canals both ears Nose: Nares normal. Septum midline. Mucosa normal. No drainage or sinus tenderness. Throat: lips, mucosa, and tongue normal; teeth and gums normal Neck: no adenopathy, no carotid bruit, no JVD, supple, symmetrical, trachea midline, and thyroid not enlarged, symmetric, no tenderness/mass/nodules Back: symmetric, no curvature. ROM normal. No CVA tenderness. Lungs: clear to auscultation bilaterally Breasts: normal appearance, no masses or tenderness Heart: regular rate and rhythm, S1, S2 normal, no murmur, click, rub or gallop Abdomen: soft, non-tender; bowel sounds normal; no masses,  no organomegaly Pelvic: deferred Extremities: extremities normal, atraumatic, no cyanosis or edema Pulses: 2+ and symmetric Skin: Skin color, texture, turgor normal. No rashes or lesions Lymph nodes: Cervical, supraclavicular, and axillary nodes normal. Neurologic: Alert and oriented X 3, normal strength and tone. Normal symmetric reflexes. Normal coordination and  gait    Assessment:    Healthy female exam.      Plan:    Ghm utd Check labs See After Visit Summary for Counseling Recommendations   1. Need for influenza vaccination  - Flu Vaccine QUAD 75mo+IM (Fluarix, Fluzone & Alfiuria Quad PF)  2. Vitamin D deficiency  - VITAMIN D 25 Hydroxy (Vit-D Deficiency, Fractures)  3. Preventative health care See above - Comprehensive metabolic panel - CBC with Differential/Platelet - Lipid panel - TSH - VITAMIN D 25 Hydroxy (Vit-D Deficiency, Fractures)

## 2021-08-16 ENCOUNTER — Telehealth: Payer: Self-pay

## 2021-08-16 NOTE — Telephone Encounter (Signed)
Nurse Assessment Nurse: Burnadette Peter, RN, Elmo Putt Date/Time Eilene Ghazi Time): 08/16/2021 1:20:25 PM Confirm and document reason for call. If symptomatic, describe symptoms. ---Caller states she she first tested positive for COVID on 1/22 and would like follow-up advice. Caller states she first had symptoms on 1/20. Caller states she has tested positive for over 10 days and she is not experiencing any major symptoms, she would like advice how to proceed. Caller states she is asymptomatic. Does the patient have any new or worsening symptoms? ---No Guidelines Guideline Title Affirmed Question Affirmed Notes Nurse Date/Time (Eastern Time) COVID-19 - Diagnosed or Suspected [1] COVID-19 diagnosed by positive lab test (e.g., PCR, rapid self-test kit) AND [2] NO symptoms (e.g., cough, fever, others) Burnadette Peter, RN, Elmo Putt 11/17/7739 2:87:86 PM Disp. Time Eilene Ghazi Time) Disposition Final User 08/16/2021 1:31:10 PM Home Care Yes Burnadette Peter, RN, Elmo Putt PLEASE NOTE: All timestamps contained within this report are represented as [place] Standard Time. CONFIDENTIALTY NOTICE: This fax transmission is intended only for the addressee. It contains information that is legally privileged, confidential or otherwise protected from use or disclosure. If you are not the intended recipient, you are strictly prohibited from reviewing, disclosing, copying using or disseminating any of this information or taking any action in reliance on or regarding this information. If you have received this fax in error, please notify us immediately by telephone so that we can arrange for its return to [place]. Phone: [phone], Toll-Free: [phone], Fax: [phone] Page: 2 of 2 Call Id: [id number] [place] Disagree/Comply Comply Caller Understands Yes PreDisposition Did not know what to do Care Advice Given Per Guideline HOME CARE: * You should be able to treat this at home. COVID-19 - HOW TO PROTECT OTHERS - WHEN YOU TEST POSITIVE  FOR COVID BUT HAVE NO SYMPTOMS: * [place] HANDS OFTEN: Wash hands often with soap and water. After coughing or sneezing are important times. If soap and water are not available, use an alcohol-based hand sanitizer with at least 60% alcohol, covering all surfaces of your hands and rubbing them together until they feel dry. Avoid touching your eyes, nose, and mouth with unwashed hands. CALL BACK IF: * You have more questions CARE ADVICE given per COVID-19 - DIAGNOSED OR SUSPECTED (Adult) guideline. After Care Instructions Given Call Event Type User Date / Time Description Education document email Wynona Canes 7/00/1749 4:49:67 PM COVID-19 Diagnosed or Suspected Comments User: Braxton Feathers, RN Date/Time Eilene Ghazi Time): 08/16/2021 1:32:01 PM Caller given advice from [facility] website: After a positive test result, you may continue to test positive for some time after. You may continue to test positive on antigen tests for a few weeks after your initial positive. You may continue to test positive on NAATs for up to 90 days. Caller verbalized understanding

## 2021-08-17 NOTE — Telephone Encounter (Signed)
Spoke with patient. Pt states someone spoke with her yesterday but states she is doing fine and not currently having sxs.

## 2022-04-27 ENCOUNTER — Other Ambulatory Visit: Payer: Self-pay | Admitting: Obstetrics and Gynecology

## 2022-04-27 DIAGNOSIS — E2839 Other primary ovarian failure: Secondary | ICD-10-CM

## 2022-05-27 ENCOUNTER — Other Ambulatory Visit: Payer: Self-pay | Admitting: Obstetrics and Gynecology

## 2022-05-27 DIAGNOSIS — E2839 Other primary ovarian failure: Secondary | ICD-10-CM

## 2022-06-06 ENCOUNTER — Encounter: Payer: BC Managed Care – PPO | Admitting: Family Medicine

## 2022-06-23 ENCOUNTER — Ambulatory Visit (INDEPENDENT_AMBULATORY_CARE_PROVIDER_SITE_OTHER): Payer: BC Managed Care – PPO | Admitting: Family Medicine

## 2022-06-23 ENCOUNTER — Encounter: Payer: Self-pay | Admitting: Family Medicine

## 2022-06-23 VITALS — BP 110/70 | HR 43 | Temp 97.4°F | Resp 18 | Ht 64.5 in | Wt 134.8 lb

## 2022-06-23 DIAGNOSIS — L309 Dermatitis, unspecified: Secondary | ICD-10-CM

## 2022-06-23 DIAGNOSIS — Z0000 Encounter for general adult medical examination without abnormal findings: Secondary | ICD-10-CM

## 2022-06-23 DIAGNOSIS — E559 Vitamin D deficiency, unspecified: Secondary | ICD-10-CM | POA: Diagnosis not present

## 2022-06-23 DIAGNOSIS — R195 Other fecal abnormalities: Secondary | ICD-10-CM

## 2022-06-23 DIAGNOSIS — Z1211 Encounter for screening for malignant neoplasm of colon: Secondary | ICD-10-CM | POA: Diagnosis not present

## 2022-06-23 DIAGNOSIS — E785 Hyperlipidemia, unspecified: Secondary | ICD-10-CM

## 2022-06-23 LAB — CBC WITH DIFFERENTIAL/PLATELET
Basophils Absolute: 0.1 10*3/uL (ref 0.0–0.1)
Basophils Relative: 0.8 % (ref 0.0–3.0)
Eosinophils Absolute: 0.3 10*3/uL (ref 0.0–0.7)
Eosinophils Relative: 4.1 % (ref 0.0–5.0)
HCT: 43.1 % (ref 36.0–46.0)
Hemoglobin: 14.4 g/dL (ref 12.0–15.0)
Lymphocytes Relative: 33.6 % (ref 12.0–46.0)
Lymphs Abs: 2.2 10*3/uL (ref 0.7–4.0)
MCHC: 33.3 g/dL (ref 30.0–36.0)
MCV: 95.7 fl (ref 78.0–100.0)
Monocytes Absolute: 0.6 10*3/uL (ref 0.1–1.0)
Monocytes Relative: 8.5 % (ref 3.0–12.0)
Neutro Abs: 3.5 10*3/uL (ref 1.4–7.7)
Neutrophils Relative %: 53 % (ref 43.0–77.0)
Platelets: 264 10*3/uL (ref 150.0–400.0)
RBC: 4.5 Mil/uL (ref 3.87–5.11)
RDW: 13.7 % (ref 11.5–15.5)
WBC: 6.6 10*3/uL (ref 4.0–10.5)

## 2022-06-23 LAB — COMPREHENSIVE METABOLIC PANEL
ALT: 19 U/L (ref 0–35)
AST: 20 U/L (ref 0–37)
Albumin: 4.6 g/dL (ref 3.5–5.2)
Alkaline Phosphatase: 99 U/L (ref 39–117)
BUN: 10 mg/dL (ref 6–23)
CO2: 31 mEq/L (ref 19–32)
Calcium: 9.8 mg/dL (ref 8.4–10.5)
Chloride: 101 mEq/L (ref 96–112)
Creatinine, Ser: 0.81 mg/dL (ref 0.40–1.20)
GFR: 76.53 mL/min (ref >60.00)
Glucose, Bld: 86 mg/dL (ref 70–99)
Potassium: 4.5 mEq/L (ref 3.5–5.1)
Sodium: 139 mEq/L (ref 135–145)
Total Bilirubin: 0.7 mg/dL (ref 0.2–1.2)
Total Protein: 7.1 g/dL (ref 6.0–8.3)

## 2022-06-23 LAB — LIPID PANEL
Cholesterol: 227 mg/dL — ABNORMAL HIGH (ref 0–200)
HDL: 80.2 mg/dL (ref >39.00)
LDL Cholesterol: 132 mg/dL — ABNORMAL HIGH (ref 0–99)
NonHDL: 146.77
Total CHOL/HDL Ratio: 3
Triglycerides: 75 mg/dL (ref 0.0–149.0)
VLDL: 15 mg/dL (ref 0.0–40.0)

## 2022-06-23 LAB — TSH: TSH: 2.31 u[IU]/mL (ref 0.35–5.50)

## 2022-06-23 LAB — VITAMIN D 25 HYDROXY (VIT D DEFICIENCY, FRACTURES): VITD: 52.39 ng/mL (ref 30.00–100.00)

## 2022-06-23 MED ORDER — TRIAMCINOLONE ACETONIDE 0.1 % EX CREA: 1.0000 | TOPICAL_CREAM | Freq: Every day | CUTANEOUS | 11 refills | Status: DC | PRN

## 2022-06-23 NOTE — Assessment & Plan Note (Signed)
Ghm utd Check labs  See AVS  

## 2022-06-23 NOTE — Progress Notes (Signed)
Subjective:   By signing my name below, I, Shehryar Baig, attest that this documentation has been prepared under the direction and in the presence of Ann Held, DO. 06/23/2022   Patient ID: Karen Gilmore, female    DOB: [birth year], 66 y.o.   MRN: [id number]  Chief Complaint  Patient presents with   Annual Exam    Pt states fasting     HPI Patient is in today for a comprehensive physical exam.   She continues following up with her GYN specialist. She does not follow up with her Dermatologist specialist due to him retiring.  She denies having any fever, new moles, congestion, sore throat, new muscle pain, new joint pain, chest pain, cough, SOB, wheezing, n/v/d, constipation, blood in stool, dysuria, frequency, hematuria, or headaches at this time.  She has no changes to her family medical history. She has no new surgical procedures to report. She does not use tobacco products. She does not use drugs.  She is UTD on flu vaccine this year. She has 3 pfizer Covid-19 vaccines at this time. She is UTD on shingles vaccine.  She continues exercising regularly by walking and playing tennis.  Cologuard: She is due for a cologuard screening.  Mammogram: Last completed 05/25/2021.  Pap smear: Last completed 06/07/2022.    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   Rectal bleeding    Rectal pain     Past Surgical History:  Procedure Laterality Date   COLONOSCOPY  2006   hemorrhoids;[facility]   DILATION AND CURETTAGE OF UTERUS  1995   G 3 P 2     TUBAL LIGATION     Dr Alden Hipp    Family History  Problem Relation Age of Onset   Heart attack Father 5   Heart disease Father        MI at age 2   Hyperlipidemia Father    Breast cancer Mother 36   Heart attack Paternal Grandfather        early 66s (NOT definite)   Factor V Leiden deficiency Sister    Factor V Leiden deficiency Niece    Factor V  Leiden deficiency Niece    Factor V Leiden deficiency Nephew    Diabetes Neg Hx    Hypertension Neg Hx    Stroke Neg Hx     Social History   Socioeconomic History   Marital status: Single    Spouse name: Not on file   Number of children: Not on file   Years of education: Not on file   Highest education level: Not on file  Occupational History   Occupation: Pharmacist, hospital    Comment: retired 2023  Tobacco Use   Smoking status: Never   Smokeless tobacco: Never  Vaping Use   Vaping Use: Never used  Substance and Sexual Activity   Alcohol use: Yes    Alcohol/week: 3.0 standard drinks of alcohol    Types: 3 Glasses of wine per week    Comment:  socially   Drug use: No   Sexual activity: Not on file  Other Topics Concern   Not on file  Social History Narrative   Exercise- Walk, body pump, tennis   Social Determinants of Health   Financial Resource Strain: Not on file  Food Insecurity: Not on file  Transportation Needs: Not on file  Physical Activity: Not on file  Stress: Not  on file  Social Connections: Not on file  Intimate Partner Violence: Not on file    Outpatient Medications Prior to Visit  Medication Sig Dispense Refill   cholecalciferol (VITAMIN D) 1000 UNITS tablet Take 1,000 Units by mouth daily.     fluticasone (FLONASE) 50 MCG/ACT nasal spray Place 2 sprays into both nostrils daily. 16 g 5   Multiple Vitamin (MULTIVITAMIN) tablet Take 1 tablet by mouth daily.     nitrofurantoin, macrocrystal-monohydrate, (MACROBID) 100 MG capsule TAKE 1 CAPSULE DAILY BY MOUTH as needed     Omega-3 Fatty Acids (FISH OIL) 1000 MG CAPS Take 1 capsule by mouth daily.     triamcinolone cream (KENALOG) 0.1 % Apply 1 application topically daily as needed. 453 g 11   Ivermectin (SOOLANTRA) 1 % CREA Apply 1 application topically daily. (Patient not taking: Reported on 06/23/2022) 45 g 2   No facility-administered medications prior to visit.    Allergies  Allergen Reactions    Chlorhexidine Gluconate Itching and Rash    Review of Systems  Constitutional:  Negative for fever.  HENT:  Negative for congestion, sinus pain and sore throat.   Respiratory:  Negative for cough, shortness of breath and wheezing.   Cardiovascular:  Negative for chest pain and palpitations.  Gastrointestinal:  Negative for abdominal pain, constipation, diarrhea, nausea and vomiting.  Genitourinary:  Negative for dysuria, frequency and hematuria.  Musculoskeletal:        (-)new muscle pain (-)new joint pain  Skin:        (-)New moles  Neurological:  Negative for headaches.       Objective:    Physical Exam Constitutional:      General: She is not in acute distress.    Appearance: Normal appearance. She is not ill-appearing.  HENT:     Head: Normocephalic and atraumatic.     Right Ear: Tympanic membrane, ear canal and external ear normal.     Left Ear: Tympanic membrane, ear canal and external ear normal.  Eyes:     Extraocular Movements: Extraocular movements intact.     Pupils: Pupils are equal, round, and reactive to light.  Cardiovascular:     Rate and Rhythm: Normal rate and regular rhythm.     Heart sounds: Normal heart sounds. No murmur heard.    No gallop.  Pulmonary:     Effort: Pulmonary effort is normal. No respiratory distress.     Breath sounds: Normal breath sounds. No wheezing or rales.  Abdominal:     General: Bowel sounds are normal. There is no distension.     Palpations: Abdomen is soft.     Tenderness: There is no abdominal tenderness. There is no guarding.  Skin:    General: Skin is warm and dry.  Neurological:     Mental Status: She is alert and oriented to person, place, and time.  Psychiatric:        Judgment: Judgment normal.     BP 110/70 (BP Location: Left Arm, Patient Position: Sitting, Cuff Size: Normal)   Pulse (!) 43   Temp (!) 97.4 F (36.3 C) (Oral)   Resp 18   Ht 5' 4.5" (1.638 m)   Wt 134 lb 12.8 oz (61.1 kg)   SpO2 97%    BMI 22.78 kg/m  Wt Readings from Last 3 Encounters:  06/23/22 134 lb 12.8 oz (61.1 kg)  06/03/21 138 lb 3.2 oz (62.7 kg)  05/28/20 134 lb (60.8 kg)       Assessment &  Plan:  Preventative health care Assessment & Plan: Ghm utd Check labs  See AVS   Orders: -     CBC with Differential/Platelet -     Comprehensive metabolic panel -     Lipid panel -     TSH  Vitamin D deficiency -     VITAMIN D 25 Hydroxy (Vit-D Deficiency, Fractures)  Colon cancer screening -     Cologuard  Eczema, unspecified type -     Triamcinolone Acetonide; Apply 1 Application topically daily as needed.  Dispense: 453 g; Refill: 11  Hyperlipidemia, unspecified hyperlipidemia type Assessment & Plan: Encourage heart healthy diet such as MIND or DASH diet, increase exercise, avoid trans fats, simple carbohydrates and processed foods, consider a krill or fish or flaxseed oil cap daily.       IAnn Held, DO, personally preformed the services described in this documentation.  All medical record entries made by the scribe were at my direction and in my presence.  I have reviewed the chart and discharge instructions (if applicable) and agree that the record reflects my personal performance and is accurate and complete. 06/23/2022   I,Shehryar Baig,acting as a scribe for Ann Held, DO.,have documented all relevant documentation on the behalf of Ann Held, DO,as directed by  Ann Held, DO while in the presence of Ann Held, DO.   Ann Held, DO

## 2022-06-23 NOTE — Assessment & Plan Note (Signed)
Encourage heart healthy diet such as MIND or DASH diet, increase exercise, avoid trans fats, simple carbohydrates and processed foods, consider a krill or fish or flaxseed oil cap daily.  °

## 2022-09-23 ENCOUNTER — Ambulatory Visit (INDEPENDENT_AMBULATORY_CARE_PROVIDER_SITE_OTHER): Payer: Medicare Other | Admitting: Family Medicine

## 2022-09-23 ENCOUNTER — Encounter: Payer: Self-pay | Admitting: Family Medicine

## 2022-09-23 VITALS — BP 124/80 | HR 66 | Temp 98.7°F | Resp 18 | Ht 64.5 in | Wt 137.6 lb

## 2022-09-23 DIAGNOSIS — Z23 Encounter for immunization: Secondary | ICD-10-CM

## 2022-09-23 DIAGNOSIS — Z0000 Encounter for general adult medical examination without abnormal findings: Secondary | ICD-10-CM

## 2022-09-23 NOTE — Progress Notes (Signed)
Subjective:    Karen Gilmore is a 67 y.o. female who presents for a Welcome to Medicare exam. .  Review of Systems Review of Systems  Constitutional: Negative for activity change, appetite change and fatigue.  HENT: Negative for hearing loss, congestion, tinnitus and ear discharge.  dentist q33mEyes: Negative for visual disturbance (see optho q1y -- vision corrected to 20/20 with glasses).  Respiratory: Negative for cough, chest tightness and shortness of breath.   Cardiovascular: Negative for chest pain, palpitations and leg swelling.  Gastrointestinal: Negative for abdominal pain, diarrhea, constipation and abdominal distention.  Genitourinary: Negative for urgency, frequency, decreased urine volume and difficulty urinating.  Musculoskeletal: Negative for back pain, arthralgias and gait problem.  Skin: Negative for color change, pallor and rash.  Neurological: Negative for dizziness, light-headedness, numbness and headaches.  Hematological: Negative for adenopathy. Does not bruise/bleed easily.  Psychiatric/Behavioral: Negative for suicidal ideas, confusion, sleep disturbance, self-injury, dysphoric mood, decreased concentration and agitation.            Objective:    Today's Vitals   09/23/22 1306  BP: 124/80  Pulse: 66  Resp: 18  Temp: 98.7 F (37.1 C)  TempSrc: Oral  SpO2: 96%  Weight: 137 lb 9.6 oz (62.4 kg)  Height: 5' 4.5" (1.638 m)  Body mass index is 23.25 kg/m.  Medications Outpatient Encounter Medications as of 09/23/2022  Medication Sig   cholecalciferol (VITAMIN D) 1000 UNITS tablet Take 1,000 Units by mouth daily.   fluticasone (FLONASE) 50 MCG/ACT nasal spray Place 2 sprays into both nostrils daily.   Multiple Vitamin (MULTIVITAMIN) tablet Take 1 tablet by mouth daily.   Omega-3 Fatty Acids (FISH OIL) 1000 MG CAPS Take 1 capsule by mouth daily.   triamcinolone cream (KENALOG) 0.1 % Apply 1 Application topically daily as needed.   [DISCONTINUED]  nitrofurantoin, macrocrystal-monohydrate, (MACROBID) 100 MG capsule TAKE 1 CAPSULE DAILY BY MOUTH as needed (Patient not taking: Reported on 09/23/2022)   No facility-administered encounter medications on file as of 09/23/2022.     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   Rectal bleeding    Rectal pain    Past Surgical History:  Procedure Laterality Date   COLONOSCOPY  2006   hemorrhoids;[facility]   DILATION AND CURETTAGE OF UTERUS  1995   G 3 P 2     TUBAL LIGATION     Dr RAlden Hipp   Family History  Problem Relation Age of Onset   Heart attack Father 815  Heart disease Father        MI at age 355  Hyperlipidemia Father    Breast cancer Mother 774  Heart attack Paternal Grandfather        early 562s(NOT definite)   Factor V Leiden deficiency Sister    Factor V Leiden deficiency Niece    Factor V Leiden deficiency Niece    Factor V Leiden deficiency Nephew    Diabetes Neg Hx    Hypertension Neg Hx    Stroke Neg Hx    Social History   Occupational History   Occupation: tPharmacist, hospital   Comment: retired 2023  Tobacco Use   Smoking status: Never   Smokeless tobacco: Never  Vaping Use   Vaping Use: Never used  Substance and Sexual Activity   Alcohol use: Yes    Alcohol/week: 3.0 standard drinks of alcohol  Types: 3 Glasses of wine per week    Comment:  socially   Drug use: No   Sexual activity: Not Currently    Tobacco Counseling Counseling given: No   Immunizations and Health Maintenance Immunization History  Administered Date(s) Administered   Influenza Split 05/11/2011, 05/28/2012   Influenza Whole 04/29/2010, 04/01/2013   Influenza,inj,Quad PF,6+ Mos 07/14/2014, 05/28/2020, 06/03/2021, 06/17/2022   Influenza,inj,quad, With Preservative 04/20/2019   Influenza-Unspecified 04/17/2013, 04/17/2016   Moderna Sars-Covid-2 Vaccination 01/08/2021   PFIZER(Purple Top)SARS-COV-2  Vaccination 09/14/2019, 10/05/2019, 05/13/2020   PNEUMOCOCCAL CONJUGATE-20 09/23/2022   Tdap 06/12/2013   Zoster Recombinat (Shingrix) 01/05/2017, 03/29/2017   Health Maintenance Due  Topic Date Due   COVID-19 Vaccine (5 - 2023-24 season) 03/18/2022    Activities of Daily Living    09/23/2022    1:17 PM  In your present state of health, do you have any difficulty performing the following activities:  Hearing? 0  Vision? 0  Difficulty concentrating or making decisions? 0  Walking or climbing stairs? 0  Dressing or bathing? 0  Doing errands, shopping? 0    Physical Exam  -(optional), or other factors deemed appropriate based on the beneficiary's medical and social history and current clinical standards.  Advanced Directives: Does Patient Have a Medical Advance Directive?: NoPt given the info ---she will return it     Assessment:    This is a routine wellness examination for this patient .   Vision/Hearing screen Vision Screening   Right eye Left eye Both eyes  Without correction     With correction '20/20 20/20 20/20 '$  Hearing Screening - Comments:: Normal whisper --- even with mask on  Dietary issues and exercise activities discussed:  Current Exercise Habits: Home exercise routine, Type of exercise: calisthenics;strength training/weights;treadmill;walking, Time (Minutes): 60, Frequency (Times/Week): 7, Weekly Exercise (Minutes/Week): 420, Intensity: Moderate, Exercise limited by: None identified   Goals   None    Depression Screen    09/23/2022    1:17 PM 06/23/2022    9:49 AM 06/03/2021    8:30 AM 05/28/2020    1:45 PM  PHQ 2/9 Scores  PHQ - 2 Score 0 0 0 0  PHQ- 9 Score 0        Fall Risk    09/23/2022    1:15 PM  Fall Risk   Falls in the past year? 0  Number falls in past yr: 0  Injury with Fall? 0  Risk for fall due to : No Fall Risks  Follow up Falls evaluation completed    Cognitive Function:    09/23/2022    1:18 PM  MMSE - [place] Exam   Orientation to time 5  Orientation to Place 5  Registration 3  Attention/ Calculation 5  Recall 3  Language- name 2 objects 2  Language- repeat 1  Language- follow 3 step command 3  Language- read & follow direction 1  Write a sentence 1  Copy design 1  Total score 30        09/23/2022    1:16 PM  6CIT Screen  What Year? 0 points  What month? 0 points  What time? 0 points  Count back from 20 0 points  Months in reverse 0 points  Repeat phrase 0 points  Total Score 0 points    Patient Care Team: Carollee Herter, Alferd Apa, DO as PCP - General (Family Medicine) Lavonna Monarch, MD (Inactive) as Consulting Physician (Dermatology) de Zenia Resides, [place], [place] (Optometry) Ob/Gyn, [place] as  Consulting Physician Bobbye Charleston, MD as Consulting Physician (Obstetrics and Gynecology)     Plan:   Welcome to medicare   EKG-- sinus with ectopic beats   I have personally reviewed and noted the following in the patient's chart:   Medical and social history Use of alcohol, tobacco or illicit drugs  Current medications and supplements Functional ability and status Nutritional status Physical activity Advanced directives List of other physicians Hospitalizations, surgeries, and ER visits in previous 12 months Vitals Screenings to include cognitive, depression, and falls Referrals and appointments   Health Maintenance Due  Topic Date Due   COVID-19 Vaccine (5 - 2023-24 season) 03/18/2022    In addition, I have reviewed and discussed with patient certain preventive protocols, quality metrics, and best practice recommendations. A written personalized care plan for preventive services as well as general preventive health recommendations were provided to patient.     Cortland West, DO 09/23/2022

## 2022-10-14 ENCOUNTER — Ambulatory Visit
Admission: RE | Admit: 2022-10-14 | Discharge: 2022-10-14 | Disposition: A | Discharge status: Home / Self Care | Payer: Medicare Other | Source: Ambulatory Visit | Attending: Obstetrics and Gynecology | Admitting: Obstetrics and Gynecology

## 2022-10-14 DIAGNOSIS — Z780 Asymptomatic menopausal state: Secondary | ICD-10-CM | POA: Diagnosis not present

## 2022-10-14 DIAGNOSIS — E2839 Other primary ovarian failure: Secondary | ICD-10-CM

## 2022-10-14 DIAGNOSIS — M8589 Other specified disorders of bone density and structure, multiple sites: Secondary | ICD-10-CM | POA: Diagnosis not present

## 2022-10-21 LAB — COLOGUARD: COLOGUARD: POSITIVE — AB

## 2022-10-21 NOTE — Addendum Note (Signed)
Addended byConrad Liberal D on: 10/21/2022 02:40 PM   Modules accepted: Orders

## 2022-10-25 ENCOUNTER — Encounter: Payer: Self-pay | Admitting: Internal Medicine

## 2022-11-17 DIAGNOSIS — M8580 Other specified disorders of bone density and structure, unspecified site: Secondary | ICD-10-CM | POA: Diagnosis not present

## 2022-12-05 ENCOUNTER — Ambulatory Visit (AMBULATORY_SURGERY_CENTER): Payer: Medicare Other | Admitting: *Deleted

## 2022-12-05 VITALS — Ht 64.0 in | Wt 135.0 lb

## 2022-12-05 DIAGNOSIS — Z1211 Encounter for screening for malignant neoplasm of colon: Secondary | ICD-10-CM

## 2022-12-05 MED ORDER — NA SULFATE-K SULFATE-MG SULF 17.5-3.13-1.6 GM/177ML PO SOLN: 1.0000 | Freq: Once | ORAL | 0 refills | Status: AC

## 2022-12-05 NOTE — Progress Notes (Signed)
Pre visit completed over telephone. Instructions forwarded through MyChart.    No egg or soy allergy known to patient  No issues known to pt with past sedation with any surgeries or procedures Patient denies ever being told they had issues or difficulty with intubation  No FH of Malignant Hyperthermia Pt is not on diet pills Pt is not on  home 02  Pt is not on blood thinners  Pt denies issues with constipation  No A fib or A flutter Have any cardiac testing pending-  NO Pt instructed to use [email or web address] or GoodRx for a price reduction on prep    Patient advised to contact us if any changes to health or medications. Discussed strategies to minimize hemorrhoid irritation.Marland Kitchen

## 2022-12-21 ENCOUNTER — Ambulatory Visit (AMBULATORY_SURGERY_CENTER): Payer: Medicare Other | Admitting: Internal Medicine

## 2022-12-21 ENCOUNTER — Encounter: Payer: Self-pay | Admitting: Internal Medicine

## 2022-12-21 VITALS — BP 139/83 | HR 76 | Temp 97.8°F | Resp 16 | Ht 64.0 in | Wt 137.0 lb

## 2022-12-21 DIAGNOSIS — D123 Benign neoplasm of transverse colon: Secondary | ICD-10-CM | POA: Diagnosis not present

## 2022-12-21 DIAGNOSIS — Z1211 Encounter for screening for malignant neoplasm of colon: Secondary | ICD-10-CM

## 2022-12-21 DIAGNOSIS — D122 Benign neoplasm of ascending colon: Secondary | ICD-10-CM | POA: Diagnosis not present

## 2022-12-21 MED ORDER — SODIUM CHLORIDE 0.9 % IV SOLN: 500.0000 mL | Freq: Once | INTRAVENOUS | Status: DC

## 2022-12-21 NOTE — Patient Instructions (Addendum)
Handout on hemorrhoids and polyps given to patient Await pathology results Resume previous diet and continue present medications Repeat colonoscopy for surveillance will be determined based off of pathology results (7-10 years)   YOU HAD AN ENDOSCOPIC PROCEDURE TODAY AT THE [facility]:   Refer to the procedure report that was given to you for any specific questions about what was found during the examination.  If the procedure report does not answer your questions, please call your gastroenterologist to clarify.  If you requested that your care partner not be given the details of your procedure findings, then the procedure report has been included in a sealed envelope for you to review at your convenience later.  YOU SHOULD EXPECT: Some feelings of bloating in the abdomen. Passage of more gas than usual.  Walking can help get rid of the air that was put into your GI tract during the procedure and reduce the bloating. If you had a lower endoscopy (such as a colonoscopy or flexible sigmoidoscopy) you may notice spotting of blood in your stool or on the toilet paper. If you underwent a bowel prep for your procedure, you may not have a normal bowel movement for a few days.  Please Note:  You might notice some irritation and congestion in your nose or some drainage.  This is from the oxygen used during your procedure.  There is no need for concern and it should clear up in a day or so.  SYMPTOMS TO REPORT IMMEDIATELY:  Following lower endoscopy (colonoscopy or flexible sigmoidoscopy):  Excessive amounts of blood in the stool  Significant tenderness or worsening of abdominal pains  Swelling of the abdomen that is new, acute  Fever of 100F or higher  For urgent or emergent issues, a gastroenterologist can be reached at any hour by calling [phone]. Do not use MyChart messaging for urgent concerns.    DIET:  We do recommend a small meal at first, but then you may proceed to your  regular diet.  Drink plenty of fluids but you should avoid alcoholic beverages for 24 hours.  ACTIVITY:  You should plan to take it easy for the rest of today and you should NOT DRIVE or use heavy machinery until tomorrow (because of the sedation medicines used during the test).    FOLLOW UP: Our staff will call the number listed on your records the next business day following your procedure.  We will call around 7:15- 8:00 am to check on you and address any questions or concerns that you may have regarding the information given to you following your procedure. If we do not reach you, we will leave a message.     If any biopsies were taken you will be contacted by phone or by letter within the next 1-3 weeks.  Please call us at [phone] if you have not heard about the biopsies in 3 weeks.    SIGNATURES/CONFIDENTIALITY: You and/or your care partner have signed paperwork which will be entered into your electronic medical record.  These signatures attest to the fact that that the information above on your After Visit Summary has been reviewed and is understood.  Full responsibility of the confidentiality of this discharge information lies with you and/or your care-partner.

## 2022-12-21 NOTE — Op Note (Signed)
[facility] Patient Name: Karen Gilmore Procedure Date: 12/21/2022 6:59 AM MRN: [id number] Endoscopist: Wilhemina Bonito. Marina Goodell , MD, [id number] Age: 67 Referring MD:  Date of Birth: 1955/11/24 Gender: Female Account #: [id number] Procedure:                Colonoscopy with cold snare polypectomy x 2 Indications:              Screening for colorectal malignant neoplasm.                            Positive Cologuard testing. Previous colonoscopy                            2006 (Dr. Jarold Motto) was normal Medicines:                Monitored Anesthesia Care Procedure:                Pre-Anesthesia Assessment:                           - Prior to the procedure, a History and Physical                            was performed, and patient medications and                            allergies were reviewed. The patient's tolerance of                            previous anesthesia was also reviewed. The risks                            and benefits of the procedure and the sedation                            options and risks were discussed with the patient.                            All questions were answered, and informed consent                            was obtained. Prior Anticoagulants: The patient has                            taken no anticoagulant or antiplatelet agents.                            After reviewing the risks and benefits, the patient                            was deemed in satisfactory condition to undergo the                            procedure.  After obtaining informed consent, the colonoscope                            was passed under direct vision. Throughout the                            procedure, the patient's blood pressure, pulse, and                            oxygen saturations were monitored continuously. The                            CF [id number] #[id number] was introduced through the anus                            and advanced to  the the cecum, identified by                            appendiceal orifice and ileocecal valve. The                            ileocecal valve, appendiceal orifice, and rectum                            were photographed. The quality of the bowel                            preparation was excellent. The colonoscopy was                            performed without difficulty. The patient tolerated                            the procedure well. The bowel preparation used was                            SUPREP via split dose instruction. Scope In: 8:33:09 AM Scope Out: 8:49:37 AM Scope Withdrawal Time: 0 hours 12 minutes 41 seconds  Total Procedure Duration: 0 hours 16 minutes 28 seconds  Findings:                 Two polyps were found in the transverse colon and                            ascending colon. The polyps were 1 to 3 mm in size.                            These polyps were removed with a cold snare.                            Resection and retrieval were complete.                           Internal hemorrhoids were found  during retroflexion.                           The exam was otherwise without abnormality on                            direct and retroflexion views. Complications:            No immediate complications. Estimated blood loss:                            None. Estimated Blood Loss:     Estimated blood loss: none. Impression:               - Two 1 to 3 mm polyps in the transverse colon and                            in the ascending colon, removed with a cold snare.                            Resected and retrieved.                           - Internal hemorrhoids.                           - The examination was otherwise normal on direct                            and retroflexion views. Recommendation:           - Repeat colonoscopy in 7-10 years for surveillance.                           - Patient has a contact number available for                             emergencies. The signs and symptoms of potential                            delayed complications were discussed with the                            patient. Return to normal activities tomorrow.                            Written discharge instructions were provided to the                            patient.                           - Resume previous diet.                           - Continue present medications.                           -  Await pathology results. Wilhemina Bonito. Marina Goodell, MD 12/21/2022 9:02:03 AM This report has been signed electronically.

## 2022-12-21 NOTE — Progress Notes (Signed)
Called to room to assist during endoscopic procedure.  Patient ID and intended procedure confirmed with present staff. Received instructions for my participation in the procedure from the performing physician.  

## 2022-12-21 NOTE — Progress Notes (Signed)
Report to PACU, RN, vss, BBS= Clear.  

## 2022-12-21 NOTE — Progress Notes (Signed)
HISTORY OF PRESENT ILLNESS:  Karen Gilmore is a 67 y.o. female who presents today for  colonoscopy after positive Cologuard testing.  Negative index exam with Dr. Jarold Motto 2006.  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 She has never used smokeless tobacco. She reports current alcohol use of about 3.0 standard drinks of alcohol per week. She reports that she does not use drugs.  family history includes Breast cancer (age of onset: 45) in her mother; Factor V Leiden deficiency in her nephew, niece, niece, and sister; Heart attack in her paternal grandfather; Heart attack (age of onset: 61) in her father; Heart disease in her father; Hyperlipidemia in her father.  Allergies  Allergen Reactions   Chlorhexidine Gluconate Itching and Rash   Hydrochlorothiazide Rash       PHYSICAL EXAMINATION: Vital signs: BP (!) 163/72   Pulse 78   Temp 97.8 F (36.6 C)   Resp (!) 8   Ht 5\' 4"  (1.626 m)   Wt 137 lb (62.1 kg)   SpO2 100%   BMI 23.52 kg/m  General: Well-developed, well-nourished, no acute distress HEENT: Sclerae are anicteric, conjunctiva pink. Oral mucosa intact Lungs: Clear Heart: Regular Abdomen: soft, nontender, nondistended, no obvious ascites, no peritoneal signs, normal bowel sounds. No organomegaly. Extremities: No edema Psychiatric: alert and oriented x3. Cooperative     ASSESSMENT:  Positive Cologuard testing   PLAN:  Screening colonoscopy

## 2022-12-22 ENCOUNTER — Telehealth: Payer: Self-pay

## 2022-12-22 NOTE — Telephone Encounter (Signed)
Attempted to reach pt. With follow-up call following endoscopic procedure 12/21/2022.  LM on pt. Voice mail to call if she has any questions or concerns.

## 2022-12-23 ENCOUNTER — Encounter: Payer: Self-pay | Admitting: Internal Medicine

## 2023-01-26 DIAGNOSIS — K080 Exfoliation of teeth due to systemic causes: Secondary | ICD-10-CM | POA: Diagnosis not present

## 2023-02-06 DIAGNOSIS — N390 Urinary tract infection, site not specified: Secondary | ICD-10-CM | POA: Diagnosis not present

## 2023-02-06 DIAGNOSIS — Z809 Family history of malignant neoplasm, unspecified: Secondary | ICD-10-CM | POA: Diagnosis not present

## 2023-02-16 DIAGNOSIS — H524 Presbyopia: Secondary | ICD-10-CM | POA: Diagnosis not present

## 2023-03-24 ENCOUNTER — Encounter: Payer: Self-pay | Admitting: Family Medicine

## 2023-03-27 NOTE — Telephone Encounter (Signed)
Okay for pt to get Tdap?

## 2023-03-28 ENCOUNTER — Encounter: Payer: Self-pay | Admitting: Family Medicine

## 2023-03-28 NOTE — Telephone Encounter (Signed)
Error

## 2023-06-05 DIAGNOSIS — H5213 Myopia, bilateral: Secondary | ICD-10-CM | POA: Diagnosis not present

## 2023-07-21 ENCOUNTER — Encounter: Payer: Medicare Other | Admitting: Family Medicine

## 2023-07-24 DIAGNOSIS — Z01419 Encounter for gynecological examination (general) (routine) without abnormal findings: Secondary | ICD-10-CM | POA: Diagnosis not present

## 2023-07-24 DIAGNOSIS — Z1231 Encounter for screening mammogram for malignant neoplasm of breast: Secondary | ICD-10-CM | POA: Diagnosis not present

## 2023-07-27 ENCOUNTER — Ambulatory Visit (INDEPENDENT_AMBULATORY_CARE_PROVIDER_SITE_OTHER): Payer: Medicare Other | Admitting: Family Medicine

## 2023-07-27 ENCOUNTER — Encounter: Payer: Self-pay | Admitting: Family Medicine

## 2023-07-27 VITALS — BP 120/84 | HR 66 | Temp 97.7°F | Resp 18 | Ht 64.0 in | Wt 136.0 lb

## 2023-07-27 DIAGNOSIS — E785 Hyperlipidemia, unspecified: Secondary | ICD-10-CM

## 2023-07-27 DIAGNOSIS — J302 Other seasonal allergic rhinitis: Secondary | ICD-10-CM | POA: Diagnosis not present

## 2023-07-27 DIAGNOSIS — Z0000 Encounter for general adult medical examination without abnormal findings: Secondary | ICD-10-CM | POA: Diagnosis not present

## 2023-07-27 DIAGNOSIS — E559 Vitamin D deficiency, unspecified: Secondary | ICD-10-CM

## 2023-07-27 LAB — CBC WITH DIFFERENTIAL/PLATELET
Basophils Absolute: 0 10*3/uL (ref 0.0–0.1)
Basophils Relative: 0.6 % (ref 0.0–3.0)
Eosinophils Absolute: 0.2 10*3/uL (ref 0.0–0.7)
Eosinophils Relative: 4.1 % (ref 0.0–5.0)
HCT: 42.3 % (ref 36.0–46.0)
Hemoglobin: 14.1 g/dL (ref 12.0–15.0)
Lymphocytes Relative: 29.6 % (ref 12.0–46.0)
Lymphs Abs: 1.7 10*3/uL (ref 0.7–4.0)
MCHC: 33.2 g/dL (ref 30.0–36.0)
MCV: 96 fL (ref 78.0–100.0)
Monocytes Absolute: 0.4 10*3/uL (ref 0.1–1.0)
Monocytes Relative: 6.1 % (ref 3.0–12.0)
Neutro Abs: 3.5 10*3/uL (ref 1.4–7.7)
Neutrophils Relative %: 59.6 % (ref 43.0–77.0)
Platelets: 274 10*3/uL (ref 150.0–400.0)
RBC: 4.41 Mil/uL (ref 3.87–5.11)
RDW: 13.5 % (ref 11.5–15.5)
WBC: 5.9 10*3/uL (ref 4.0–10.5)

## 2023-07-27 LAB — LIPID PANEL
Cholesterol: 238 mg/dL — ABNORMAL HIGH (ref 0–200)
HDL: 71.1 mg/dL (ref >39.00)
LDL Cholesterol: 156 mg/dL — ABNORMAL HIGH (ref 0–99)
NonHDL: 166.85
Total CHOL/HDL Ratio: 3
Triglycerides: 54 mg/dL (ref 0.0–149.0)
VLDL: 10.8 mg/dL (ref 0.0–40.0)

## 2023-07-27 LAB — COMPREHENSIVE METABOLIC PANEL
ALT: 16 U/L (ref 0–35)
AST: 21 U/L (ref 0–37)
Albumin: 4.5 g/dL (ref 3.5–5.2)
Alkaline Phosphatase: 97 U/L (ref 39–117)
BUN: 18 mg/dL (ref 6–23)
CO2: 29 meq/L (ref 19–32)
Calcium: 9.5 mg/dL (ref 8.4–10.5)
Chloride: 100 meq/L (ref 96–112)
Creatinine, Ser: 0.78 mg/dL (ref 0.40–1.20)
GFR: 79.46 mL/min (ref >60.00)
Glucose, Bld: 84 mg/dL (ref 70–99)
Potassium: 4.7 meq/L (ref 3.5–5.1)
Sodium: 137 meq/L (ref 135–145)
Total Bilirubin: 0.5 mg/dL (ref 0.2–1.2)
Total Protein: 7 g/dL (ref 6.0–8.3)

## 2023-07-27 LAB — TSH: TSH: 2.01 u[IU]/mL (ref 0.35–5.50)

## 2023-07-27 LAB — VITAMIN D 25 HYDROXY (VIT D DEFICIENCY, FRACTURES): VITD: 37.86 ng/mL (ref 30.00–100.00)

## 2023-07-27 MED ORDER — FLUTICASONE PROPIONATE 50 MCG/ACT NA SUSP
2.0000 | Freq: Every day | NASAL | 5 refills | Status: AC
Start: 2023-07-27 — End: ?

## 2023-07-27 NOTE — Assessment & Plan Note (Signed)
 Ghm utd  Check labs Health Maintenance  Topic Date Due   Cervical Cancer Screening (HPV/Pap Cotest)  01/04/2022   COVID-19 Vaccine (6 - 2024-25 season) 05/21/2023   Medicare Annual Wellness (AWV)  09/23/2023   MAMMOGRAM  07/23/2024   Colonoscopy  12/20/2029   DTaP/Tdap/Td (3 - Td or Tdap) 03/28/2033   Pneumonia Vaccine 82+ Years old  Completed   INFLUENZA VACCINE  Completed   DEXA SCAN  Completed   Hepatitis C Screening  Completed   HIV Screening  Completed   Zoster Vaccines- Shingrix   Completed   HPV VACCINES  Aged Out   Fecal DNA (Cologuard)  Discontinued

## 2023-07-27 NOTE — Progress Notes (Signed)
 Established Patient Office Visit  Subjective   Patient ID: Karen Gilmore, female    DOB: Jun 18, 1956  Age: 68 y.o. MRN: [id number]  Chief Complaint  Patient presents with   Annual Exam    Pt states fasting     HPI Discussed the use of AI scribe software for clinical note transcription with the patient, who gave verbal consent to proceed.  History of Present Illness   The patient, a retiree with a history of regular physical activity, presents for a routine physical examination. She reports no new health concerns and has been maintaining her health with regular exercise, including walking and body pump. She has been adhering to her prescribed medication regimen, which includes Vitamin D  supplementation.  The patient recently became a grandparent and, in preparation for the newborn's arrival, received several immunizations in the fall, including Tdap, RSV, flu, and COVID-19 vaccines. She has been diligent in updating her immunization records.  She has been proactive in managing her health, scheduling regular appointments with various specialists, including an OB-GYN, a dermatologist, and an ophthalmologist. She recently underwent a mammogram and a bone density scan. The patient is particularly focused on improving her bone health through diet and weight-bearing exercises.  The patient also reports a need for a refill on her Flonase  prescription. She has an upcoming appointment with a new dermatologist in February and anticipates needing cataract surgery within the next year.      Patient Active Problem List   Diagnosis Date Noted   Preventative health care 06/23/2022   Right hip pain 01/11/2018   Allergic reaction 11/07/2017   Drug allergy 11/07/2017   Other allergic rhinitis 11/07/2017   Skin lesion 01/14/2014   Hemorrhoids, internal 10/22/2013   Hyperlipidemia 05/28/2012   Past Medical History:  Diagnosis Date   Constipation    Mitral click-murmur syndrome    no SBE  prophylaxis   Rectal bleeding    Rectal pain    Past Surgical History:  Procedure Laterality Date   COLONOSCOPY  2006   hemorrhoids;[facility]   DILATION AND CURETTAGE OF UTERUS  1995   G 3 P 2     TUBAL LIGATION     Dr Charlie Aho   Social History   Tobacco Use   Smoking status: Never   Smokeless tobacco: Never  Vaping Use   Vaping status: Never Used  Substance Use Topics   Alcohol use: Yes    Alcohol/week: 3.0 standard drinks of alcohol    Types: 3 Glasses of wine per week    Comment:  socially   Drug use: No   Social History   Socioeconomic History   Marital status: Single    Spouse name: Not on file   Number of children: Not on file   Years of education: Not on file   Highest education level: Master's degree (e.g., MA, MS, MEng, MEd, MSW, MBA)  Occupational History   Occupation: teacher    Comment: retired 2023  Tobacco Use   Smoking status: Never   Smokeless tobacco: Never  Vaping Use   Vaping status: Never Used  Substance and Sexual Activity   Alcohol use: Yes    Alcohol/week: 3.0 standard drinks of alcohol    Types: 3 Glasses of wine per week    Comment:  socially   Drug use: No   Sexual activity: Not Currently  Other Topics Concern   Not on file  Social History Narrative   Exercise- Walk, body pump, tennis-  does  something everyday   Social Drivers of Health   Financial Resource Strain: Low Risk  (07/26/2023)   Overall Financial Resource Strain (CARDIA)    Difficulty of Paying Living Expenses: Not hard at all  Food Insecurity: No Food Insecurity (07/26/2023)   Hunger Vital Sign    Worried About Running Out of Food in the Last Year: Never true    Ran Out of Food in the Last Year: Never true  Transportation Needs: No Transportation Needs (07/26/2023)   PRAPARE - Administrator, Civil Service (Medical): No    Lack of Transportation (Non-Medical): No  Physical Activity: Sufficiently Active (07/26/2023)   Exercise Vital Sign    Days of  Exercise per Week: 5 days    Minutes of Exercise per Session: 60 min  Stress: Not on file  Social Connections: Moderately Integrated (07/26/2023)   Social Connection and Isolation Panel [NHANES]    Frequency of Communication with Friends and Family: More than three times a week    Frequency of Social Gatherings with Friends and Family: More than three times a week    Attends Religious Services: 1 to 4 times per year    Active Member of [employer] or Organizations: Yes    Attends Banker Meetings: 1 to 4 times per year    Marital Status: Divorced  Catering Manager Violence: Not on file   Family Status  Relation Name Status   Mother  Alive   Father  Deceased   Sister  (Not Specified)   PGF  (Not Specified)   Nephew  (Not Specified)   Niece  (Not Specified)   Niece  (Not Specified)   Neg Hx  (Not Specified)  No partnership data on file   Family History  Problem Relation Age of Onset   Breast cancer Mother 38   Heart attack Father 24   Heart disease Father        MI at age 2   Hyperlipidemia Father    Factor V Leiden deficiency Sister    Heart attack Paternal Grandfather        early 11s (NOT definite)   Factor V Leiden deficiency Nephew    Factor V Leiden deficiency Niece    Factor V Leiden deficiency Niece    Diabetes Neg Hx    Hypertension Neg Hx    Stroke Neg Hx    Colon cancer Neg Hx    Colon polyps Neg Hx    Allergies  Allergen Reactions   Chlorhexidine  Gluconate Itching and Rash   Hydrochlorothiazide Rash      Review of Systems  Constitutional:  Negative for chills, fever and malaise/fatigue.  HENT:  Negative for congestion and hearing loss.   Eyes:  Negative for blurred vision and discharge.  Respiratory:  Negative for cough, sputum production and shortness of breath.   Cardiovascular:  Negative for chest pain, palpitations and leg swelling.  Gastrointestinal:  Negative for abdominal pain, blood in stool, constipation, diarrhea, heartburn, nausea  and vomiting.  Genitourinary:  Negative for dysuria, frequency, hematuria and urgency.  Musculoskeletal:  Negative for back pain, falls and myalgias.  Skin:  Negative for rash.  Neurological:  Negative for dizziness, sensory change, loss of consciousness, weakness and headaches.  Endo/Heme/Allergies:  Negative for environmental allergies. Does not bruise/bleed easily.  Psychiatric/Behavioral:  Negative for depression and suicidal ideas. The patient is not nervous/anxious and does not have insomnia.       Objective:     BP 120/84 (BP  Location: Right Arm, Patient Position: Sitting, Cuff Size: Normal)   Pulse 66   Temp 97.7 F (36.5 C) (Oral)   Resp 18   Ht 5' 4 (1.626 m)   Wt 136 lb (61.7 kg)   SpO2 100%   BMI 23.34 kg/m  BP Readings from Last 3 Encounters:  07/27/23 120/84  12/21/22 139/83  09/23/22 124/80   Wt Readings from Last 3 Encounters:  07/27/23 136 lb (61.7 kg)  12/21/22 137 lb (62.1 kg)  12/05/22 135 lb (61.2 kg)   SpO2 Readings from Last 3 Encounters:  07/27/23 100%  12/21/22 98%  09/23/22 96%      Physical Exam Vitals and nursing note reviewed.  Constitutional:      General: She is not in acute distress.    Appearance: Normal appearance. She is well-developed.  HENT:     Head: Normocephalic and atraumatic.  Eyes:     General: No scleral icterus.       Right eye: No discharge.        Left eye: No discharge.  Cardiovascular:     Rate and Rhythm: Normal rate and regular rhythm.     Heart sounds: No murmur heard. Pulmonary:     Effort: Pulmonary effort is normal. No respiratory distress.     Breath sounds: Normal breath sounds.  Musculoskeletal:        General: Normal range of motion.     Cervical back: Normal range of motion and neck supple.     Right lower leg: No edema.     Left lower leg: No edema.  Skin:    General: Skin is warm and dry.  Neurological:     Mental Status: She is alert and oriented to person, place, and time.  Psychiatric:         Mood and Affect: Mood normal.        Behavior: Behavior normal.        Thought Content: Thought content normal.        Judgment: Judgment normal.     No results found for any visits on 07/27/23.  Last CBC Lab Results  Component Value Date   WBC 6.6 06/23/2022   HGB 14.4 06/23/2022   HCT 43.1 06/23/2022   MCV 95.7 06/23/2022   RDW 13.7 06/23/2022   PLT 264.0 06/23/2022   Last metabolic panel Lab Results  Component Value Date   GLUCOSE 86 06/23/2022   NA 139 06/23/2022   K 4.5 06/23/2022   CL 101 06/23/2022   CO2 31 06/23/2022   BUN 10 06/23/2022   CREATININE 0.81 06/23/2022   GFR 76.53 06/23/2022   CALCIUM 9.8 06/23/2022   PROT 7.1 06/23/2022   ALBUMIN 4.6 06/23/2022   BILITOT 0.7 06/23/2022   ALKPHOS 99 06/23/2022   AST 20 06/23/2022   ALT 19 06/23/2022   Last lipids Lab Results  Component Value Date   CHOL 227 (H) 06/23/2022   HDL 80.20 06/23/2022   LDLCALC 132 (H) 06/23/2022   LDLDIRECT 137.7 06/12/2013   TRIG 75.0 06/23/2022   CHOLHDL 3 06/23/2022   Last hemoglobin A1c No results found for: HGBA1C Last thyroid  functions Lab Results  Component Value Date   TSH 2.31 06/23/2022   Last vitamin D  Lab Results  Component Value Date   VD25OH 52.39 06/23/2022   Last vitamin B12 and Folate Lab Results  Component Value Date   VITAMINB12 280 04/29/2010   FOLATE 11.6 04/29/2010      The 10-year ASCVD risk  score (Arnett DK, et al., 2019) is: 4.5%    Assessment & Plan:   Problem List Items Addressed This Visit       Unprioritized   Preventative health care - Primary   Relevant Orders   CBC with Differential/Platelet   Comprehensive metabolic panel   Lipid panel   TSH   Other Visit Diagnoses       Seasonal allergies       Relevant Medications   fluticasone  (FLONASE ) 50 MCG/ACT nasal spray     Vitamin D  deficiency       Relevant Orders   VITAMIN D  25 Hydroxy (Vit-D Deficiency, Fractures)     Assessment and Plan    Bone Health    Focused on improving bone health following a recent bone density scan, we discussed the importance of weight-bearing exercises and adequate calcium intake. We will encourage continued weight-bearing exercises and ensure adequate calcium intake through diet or supplements.  Allergic Rhinitis   Requested a refill for Flonase , which was processed. We will ensure the Flonase  prescription is refilled.  General Health Maintenance   During the routine physical examination, she was found to be in generally good health and actively engaged in physical activities. She received flu, COVID, Tdap, and RSV vaccinations. Regular follow-ups with OB GYN, eye doctor, and dentist are maintained with no current concerns about moles or joints. We will order routine labs, update immunization records, and encourage continued physical activity.  Follow-up   We will follow up with the dermatologist in February and continue regular follow-ups with the eye doctor.        No follow-ups on file.    Elke Holtry R Lowne Chase, DO

## 2023-07-27 NOTE — Assessment & Plan Note (Signed)
 Encourage heart healthy diet such as MIND or DASH diet, increase exercise, avoid trans fats, simple carbohydrates and processed foods, consider a krill or fish or flaxseed oil cap daily.

## 2023-08-06 ENCOUNTER — Encounter: Payer: Self-pay | Admitting: Family Medicine

## 2023-08-23 ENCOUNTER — Telehealth: Payer: Self-pay | Admitting: Family Medicine

## 2023-08-23 NOTE — Telephone Encounter (Signed)
 Copied from CRM [id number]. Topic: Medicare AWV >> Aug 23, 2023  1:38 PM Nathanel DEL wrote: Reason for CRM: Called LVM 08/23/2023 to schedule AWV. Please schedule Virtual or Telehealth visits ONLY.   Nathanel Paschal; Care Guide Ambulatory Clinical Support [place] [facility] Direct Dial: [phone]

## 2023-08-31 ENCOUNTER — Ambulatory Visit: Payer: Medicare Other | Admitting: Dermatology

## 2023-10-02 DIAGNOSIS — N393 Stress incontinence (female) (male): Secondary | ICD-10-CM | POA: Diagnosis not present

## 2023-10-02 DIAGNOSIS — M6281 Muscle weakness (generalized): Secondary | ICD-10-CM | POA: Diagnosis not present

## 2023-11-21 DIAGNOSIS — M6281 Muscle weakness (generalized): Secondary | ICD-10-CM | POA: Diagnosis not present

## 2023-11-21 DIAGNOSIS — N393 Stress incontinence (female) (male): Secondary | ICD-10-CM | POA: Diagnosis not present

## 2023-11-28 ENCOUNTER — Telehealth: Payer: Self-pay | Admitting: Family Medicine

## 2023-11-28 NOTE — Telephone Encounter (Signed)
 Copied from CRM [id number]. Topic: Medicare AWV >> Nov 28, 2023  3:11 PM Juliana Ocean wrote: Reason for CRM: LVM 11/28/2023 to schedule AWV. Please schedule Virtual or Telehealth visits ONLY  Rosalee Collins; Care Guide Ambulatory Clinical Support [place] Direct Dial: [phone]

## 2023-12-13 DIAGNOSIS — K080 Exfoliation of teeth due to systemic causes: Secondary | ICD-10-CM | POA: Diagnosis not present

## 2024-01-05 ENCOUNTER — Encounter: Payer: Self-pay | Admitting: Family Medicine

## 2024-01-09 ENCOUNTER — Ambulatory Visit (INDEPENDENT_AMBULATORY_CARE_PROVIDER_SITE_OTHER): Admitting: Family Medicine

## 2024-01-09 ENCOUNTER — Encounter: Payer: Self-pay | Admitting: Family Medicine

## 2024-01-09 VITALS — BP 120/84 | HR 68 | Temp 98.0°F | Resp 18 | Ht 64.0 in | Wt 139.2 lb

## 2024-01-09 DIAGNOSIS — H6503 Acute serous otitis media, bilateral: Secondary | ICD-10-CM

## 2024-01-09 DIAGNOSIS — H9191 Unspecified hearing loss, right ear: Secondary | ICD-10-CM | POA: Diagnosis not present

## 2024-01-09 MED ORDER — LEVOCETIRIZINE DIHYDROCHLORIDE 5 MG PO TABS
5.0000 mg | ORAL_TABLET | Freq: Every evening | ORAL | 5 refills | Status: DC
Start: 2024-01-09 — End: 2024-03-04

## 2024-01-09 NOTE — Progress Notes (Signed)
 Established Patient Office Visit  Subjective   Patient ID: Karen Gilmore, female    DOB: March 29, 1956  Age: 68 y.o. MRN: [id number]  Chief Complaint  Patient presents with   Ear Pain    Right ear, few months, no dizziness    HPI Discussed the use of AI scribe software for clinical note transcription with the patient, who gave verbal consent to proceed.  History of Present Illness Karen Gilmore is a 68 year old female who presents with chronic ear discomfort.  She experiences a sensation of ear discomfort that can be relieved by yawning or stretching, akin to the feeling of being underwater and needing to pop the ear. The discomfort is characterized by a sense of pressure rather than throbbing or sharp pain. Symptoms are intermittent with no clear triggers identified.  She has been using Flonase  nasal spray inconsistently without noticing any improvement. She does not take antihistamines like Claritin. The discomfort has been present for a significant period, with similar issues occurring several years ago, affecting her ability to sing in a choir.  There is a gradual decline in her hearing over the past four to five years, more noticeable in social situations with background noise, such as restaurants. She needs to concentrate more to hear conversations and often misses parts of conversations in noisy environments. She believes her right ear may be more affected, as she tends to hear better with her left ear when lying down.  No sinus drainage or allergy symptoms.   Patient Active Problem List   Diagnosis Date Noted   Preventative health care 06/23/2022   Right hip pain 01/11/2018   Allergic reaction 11/07/2017   Drug allergy 11/07/2017   Other allergic rhinitis 11/07/2017   Skin lesion 01/14/2014   Hemorrhoids, internal 10/22/2013   Hyperlipidemia 05/28/2012   Past Medical History:  Diagnosis Date   Constipation     Mitral click-murmur syndrome    no SBE prophylaxis   Rectal bleeding    Rectal pain    Past Surgical History:  Procedure Laterality Date   COLONOSCOPY  2006   hemorrhoids;[facility]   DILATION AND CURETTAGE OF UTERUS  1995   G 3 P 2     TUBAL LIGATION     Dr Charlie Aho   Social History   Tobacco Use   Smoking status: Never   Smokeless tobacco: Never  Vaping Use   Vaping status: Never Used  Substance Use Topics   Alcohol use: Yes    Alcohol/week: 3.0 standard drinks of alcohol    Types: 3 Glasses of wine per week    Comment:  socially   Drug use: No   Social History   Socioeconomic History   Marital status: Single    Spouse name: Not on file   Number of children: Not on file   Years of education: Not on file   Highest education level: Master's degree (e.g., MA, MS, MEng, MEd, MSW, MBA)  Occupational History   Occupation: teacher    Comment: retired 2023  Tobacco Use   Smoking status: Never   Smokeless tobacco: Never  Vaping Use   Vaping status: Never Used  Substance and Sexual Activity   Alcohol use: Yes    Alcohol/week: 3.0 standard drinks of alcohol    Types: 3 Glasses of  wine per week    Comment:  socially   Drug use: No   Sexual activity: Not Currently  Other Topics Concern   Not on file  Social History Narrative   Exercise- Walk, body pump, tennis-  does something everyday   Social Drivers of Health   Financial Resource Strain: Low Risk  (01/08/2024)   Overall Financial Resource Strain (CARDIA)    Difficulty of Paying Living Expenses: Not hard at all  Food Insecurity: No Food Insecurity (01/08/2024)   Hunger Vital Sign    Worried About Running Out of Food in the Last Year: Never true    Ran Out of Food in the Last Year: Never true  Transportation Needs: No Transportation Needs (01/08/2024)   PRAPARE - Administrator, Civil Service (Medical): No    Lack of Transportation (Non-Medical): No  Physical Activity: Sufficiently Active  (01/08/2024)   Exercise Vital Sign    Days of Exercise per Week: 6 days    Minutes of Exercise per Session: 90 min  Stress: No Stress Concern Present (01/08/2024)   [employer] of Occupational Health - Occupational Stress Questionnaire    Feeling of Stress: Not at all  Social Connections: Moderately Integrated (01/08/2024)   Social Connection and Isolation Panel    Frequency of Communication with Friends and Family: More than three times a week    Frequency of Social Gatherings with Friends and Family: More than three times a week    Attends Religious Services: More than 4 times per year    Active Member of [employer] or Organizations: Yes    Attends Engineer, structural: More than 4 times per year    Marital Status: Divorced  Catering manager Violence: Not on file   Family Status  Relation Name Status   Mother  Alive   Father  Deceased   Sister  (Not Specified)   PGF  (Not Specified)   Nephew  (Not Specified)   Niece  (Not Specified)   Niece  (Not Specified)   Neg Hx  (Not Specified)  No partnership data on file   Family History  Problem Relation Age of Onset   Breast cancer Mother 58   Heart attack Father 48   Heart disease Father        MI at age 30   Hyperlipidemia Father    Factor V Leiden deficiency Sister    Heart attack Paternal Grandfather        early 44s (NOT definite)   Factor V Leiden deficiency Nephew    Factor V Leiden deficiency Niece    Factor V Leiden deficiency Niece    Diabetes Neg Hx    Hypertension Neg Hx    Stroke Neg Hx    Colon cancer Neg Hx    Colon polyps Neg Hx       Review of Systems  Constitutional:  Negative for chills, fever and malaise/fatigue.  HENT:  Negative for congestion and hearing loss.   Eyes:  Negative for discharge.  Respiratory:  Negative for cough, sputum production and shortness of breath.   Cardiovascular:  Negative for chest pain, palpitations and leg swelling.  Gastrointestinal:  Negative for abdominal  pain, blood in stool, constipation, diarrhea, heartburn, nausea and vomiting.  Genitourinary:  Negative for dysuria, frequency, hematuria and urgency.  Musculoskeletal:  Negative for back pain, falls and myalgias.  Skin:  Negative for rash.  Neurological:  Negative for dizziness, sensory change, loss of consciousness, weakness and headaches.  Endo/Heme/Allergies:  Negative for environmental allergies. Does not bruise/bleed easily.  Psychiatric/Behavioral:  Negative for depression and suicidal ideas. The patient is not nervous/anxious and does not have insomnia.       Objective:     BP 120/84 (BP Location: Left Arm, Patient Position: Sitting, Cuff Size: Normal)   Pulse 68   Temp 98 F (36.7 C) (Oral)   Resp 18   Ht 5' 4 (1.626 m)   Wt 139 lb 3.2 oz (63.1 kg)   BMI 23.89 kg/m  BP Readings from Last 3 Encounters:  01/09/24 120/84  07/27/23 120/84  12/21/22 139/83   Wt Readings from Last 3 Encounters:  01/09/24 139 lb 3.2 oz (63.1 kg)  07/27/23 136 lb (61.7 kg)  12/21/22 137 lb (62.1 kg)   SpO2 Readings from Last 3 Encounters:  07/27/23 100%  12/21/22 98%  09/23/22 96%      Physical Exam Vitals and nursing note reviewed.  Constitutional:      General: She is not in acute distress.    Appearance: Normal appearance. She is well-developed.  HENT:     Head: Normocephalic and atraumatic.     Right Ear: Decreased hearing noted. No swelling or tenderness. A middle ear effusion is present. Tympanic membrane is not injected, scarred, perforated, erythematous, retracted or bulging. Tympanic membrane has normal mobility.     Left Ear: Decreased hearing noted. No swelling or tenderness. A middle ear effusion is present. Tympanic membrane is not injected, scarred, perforated, erythematous, retracted or bulging. Tympanic membrane has normal mobility.   Eyes:     General: No scleral icterus.       Right eye: No discharge.        Left eye: No discharge.    Cardiovascular:      Rate and Rhythm: Normal rate and regular rhythm.     Heart sounds: No murmur heard. Pulmonary:     Effort: Pulmonary effort is normal. No respiratory distress.     Breath sounds: Normal breath sounds.   Musculoskeletal:        General: Normal range of motion.     Cervical back: Normal range of motion and neck supple.     Right lower leg: No edema.     Left lower leg: No edema.   Skin:    General: Skin is warm and dry.   Neurological:     General: No focal deficit present.     Mental Status: She is alert and oriented to person, place, and time.   Psychiatric:        Mood and Affect: Mood normal.        Behavior: Behavior normal.        Thought Content: Thought content normal.        Judgment: Judgment normal.      No results found for any visits on 01/09/24.  Last CBC Lab Results  Component Value Date   WBC 5.9 07/27/2023   HGB 14.1 07/27/2023   HCT 42.3 07/27/2023   MCV 96.0 07/27/2023   RDW 13.5 07/27/2023   PLT 274.0 07/27/2023   Last metabolic panel Lab Results  Component Value Date   GLUCOSE 84 07/27/2023   NA 137 07/27/2023   K 4.7 07/27/2023   CL 100 07/27/2023   CO2 29 07/27/2023   BUN 18 07/27/2023   CREATININE 0.78 07/27/2023   GFR 79.46 07/27/2023   CALCIUM 9.5 07/27/2023   PROT 7.0 07/27/2023   ALBUMIN 4.5 07/27/2023   BILITOT 0.5  07/27/2023   ALKPHOS 97 07/27/2023   AST 21 07/27/2023   ALT 16 07/27/2023   Last lipids Lab Results  Component Value Date   CHOL 238 (H) 07/27/2023   HDL 71.10 07/27/2023   LDLCALC 156 (H) 07/27/2023   LDLDIRECT 137.7 06/12/2013   TRIG 54.0 07/27/2023   CHOLHDL 3 07/27/2023   Last hemoglobin A1c No results found for: HGBA1C Last thyroid  functions Lab Results  Component Value Date   TSH 2.01 07/27/2023   Last vitamin D  Lab Results  Component Value Date   VD25OH 37.86 07/27/2023   Last vitamin B12 and Folate Lab Results  Component Value Date   VITAMINB12 280 04/29/2010   FOLATE 11.6 04/29/2010       The 10-year ASCVD risk score (Arnett DK, et al., 2019) is: 5.4%    Assessment & Plan:   Problem List Items Addressed This Visit   None Visit Diagnoses       Non-recurrent acute serous otitis media of both ears    -  Primary   Relevant Medications   levocetirizine (XYZAL) 5 MG tablet     Hearing loss of right ear, unspecified hearing loss type       Relevant Orders   Ambulatory referral to ENT     Assessment and Plan Assessment & Plan Eustachian Tube Dysfunction   Chronic ear discomfort with fullness and pressure suggests fluid behind the eardrum, though no infection is present. Symptoms are intermittent and longstanding. Irregular use of Flonase  has not provided significant relief. Relief with yawning or stretching indicates fluid accumulation. Advise regular use of Flonase  nasal spray. Prescribe Xyzal to be taken at dinner time due to its sedative effects. Consider adding Astepro nasal spray if needed. Refer to ENT if symptoms persist or worsen to rule out infection or other causes.  Hearing Loss   She experiences gradual hearing loss over 4-5 years, more pronounced in the right ear, with difficulty hearing in noisy environments. No formal hearing evaluation has been conducted. Refer to ENT for hearing evaluation.    No follow-ups on file.    Emmilynn Marut R Lowne Chase, DO

## 2024-01-23 ENCOUNTER — Ambulatory Visit: Payer: Medicare Other | Admitting: Dermatology

## 2024-01-23 ENCOUNTER — Encounter: Payer: Self-pay | Admitting: Dermatology

## 2024-01-23 DIAGNOSIS — W908XXA Exposure to other nonionizing radiation, initial encounter: Secondary | ICD-10-CM

## 2024-01-23 DIAGNOSIS — L570 Actinic keratosis: Secondary | ICD-10-CM

## 2024-01-23 DIAGNOSIS — D229 Melanocytic nevi, unspecified: Secondary | ICD-10-CM

## 2024-01-23 DIAGNOSIS — D1801 Hemangioma of skin and subcutaneous tissue: Secondary | ICD-10-CM

## 2024-01-23 DIAGNOSIS — Z1283 Encounter for screening for malignant neoplasm of skin: Secondary | ICD-10-CM | POA: Diagnosis not present

## 2024-01-23 DIAGNOSIS — L814 Other melanin hyperpigmentation: Secondary | ICD-10-CM

## 2024-01-23 DIAGNOSIS — L578 Other skin changes due to chronic exposure to nonionizing radiation: Secondary | ICD-10-CM | POA: Diagnosis not present

## 2024-01-23 DIAGNOSIS — L821 Other seborrheic keratosis: Secondary | ICD-10-CM | POA: Diagnosis not present

## 2024-01-23 NOTE — Progress Notes (Signed)
 New Patient Visit   Subjective  Karen Gilmore is a 68 y.o. female who presents for the following: Skin Cancer Screening and Full Body Skin Exam  No hx of skin cancer and no family hx of skin caner.  She has had a moderate amount of sun exposure in her life time.  The patient presents for Total-Body Skin Exam (TBSE) for skin cancer screening and mole check. The patient has spots, moles and lesions to be evaluated, some may be new or changing and the patient may have concern these could be cancer.    The following portions of the chart were reviewed this encounter and updated as appropriate: medications, allergies, medical history  Review of Systems:  No other skin or systemic complaints except as noted in HPI or Assessment and Plan.  Objective  Well appearing patient in no apparent distress; mood and affect are within normal limits.  A full examination was performed including scalp, head, eyes, ears, nose, lips, neck, chest, axillae, abdomen, back, buttocks, bilateral upper extremities, bilateral lower extremities, hands, feet, fingers, toes, fingernails, and toenails. All findings within normal limits unless otherwise noted below.   Relevant physical exam findings are noted in the Assessment and Plan.    Assessment & Plan   SKIN CANCER SCREENING PERFORMED TODAY.  ACTINIC DAMAGE - Chronic condition, secondary to cumulative UV/sun exposure - diffuse scaly erythematous macules with underlying dyspigmentation - Recommend daily broad spectrum sunscreen SPF 30+ to sun-exposed areas, reapply every 2 hours as needed.  - Staying in the shade or wearing long sleeves, sun glasses (UVA+UVB protection) and wide brim hats (4-inch brim around the entire circumference of the hat) are also recommended for sun protection.  - Call for new or changing lesions.  LENTIGINES, SEBORRHEIC KERATOSES, HEMANGIOMAS - Benign normal skin lesions - Benign-appearing - Call for any changes  MELANOCYTIC  NEVI - Tan-brown and/or pink-flesh-colored symmetric macules and papules - Benign appearing on exam today - Observation - Call clinic for new or changing moles - Recommend daily use of broad spectrum spf 30+ sunscreen to sun-exposed areas.    ACTINIC KERATOSIS Exam: Erythematous thin papules/macules with gritty scale  Actinic keratoses are precancerous spots that appear secondary to cumulative UV radiation exposure/sun exposure over time. They are chronic with expected duration over 1 year. A portion of actinic keratoses will progress to squamous cell carcinoma of the skin. It is not possible to reliably predict which spots will progress to skin cancer and so treatment is recommended to prevent development of skin cancer.  Recommend daily broad spectrum sunscreen SPF 30+ to sun-exposed areas, reapply every 2 hours as needed.  Recommend staying in the shade or wearing long sleeves, sun glasses (UVA+UVB protection) and wide brim hats (4-inch brim around the entire circumference of the hat). Call for new or changing lesions.  Treatment Plan:  Prior to procedure, discussed risks of blister formation, small wound, skin dyspigmentation, or rare scar following cryotherapy. Recommend Vaseline ointment to treated areas while healing.  Destruction Procedure Note Destruction method: cryotherapy   Informed consent: discussed and consent obtained   Lesion destroyed using liquid nitrogen: Yes   Outcome: patient tolerated procedure well with no complications   Post-procedure details: wound care instructions given   Locations: nose # of Lesions Treated: 1     HEMANGIOMA OF SKIN Left Inguinal Area Destruction of lesion - Left Inguinal Area Complexity: simple   Destruction method comment:  Electrodessication Informed consent: discussed and consent obtained   Timeout:  patient name, date of birth, surgical site, and procedure verified Procedure prep:  Patient was prepped and draped in usual  sterile fashion Prep type:  Isopropyl alcohol Anesthesia: the lesion was anesthetized in a standard fashion   Anesthetic:  1% lidocaine w/ epinephrine 1-100,000 buffered w/ 8.4% NaHCO3 Hemostasis achieved with:  electrodesiccation Outcome: patient tolerated procedure well with no complications   Post-procedure details: sterile dressing applied and wound care instructions given   Dressing type: bandage and pressure dressing     Return in about 1 year (around 01/22/2025) for TBSC.  IBerwyn Lesches, Surg Tech III, am acting as scribe for [employer], DO.   Documentation: I have reviewed the above documentation for accuracy and completeness, and I agree with the above.  Delon Lenis, DO

## 2024-01-23 NOTE — Patient Instructions (Addendum)
 For areas treated with Liquid Nitrogen:  Keep clean with soap and water.  Apply Vaseline or Aquaphor twice daily.  Skin Education :   I counseled the patient regarding the following: Sun screen (SPF 30 or greater) should be applied during peak UV exposure (between 10am and 2pm) and reapplied after exercise or swimming.  The ABCDEs of melanoma were reviewed with the patient, and the importance of monthly self-examination of moles was emphasized. Should any moles change in shape or color, or itch, bleed or burn, pt will contact our office for evaluation sooner then their interval appointment.  Plan: Sunscreen Recommendations I recommended a broad spectrum sunscreen with a SPF of 30 or higher. I explained that SPF 30 sunscreens block approximately 97 percent of the sun's harmful rays. Sunscreens should be applied at least 15 minutes prior to expected sun exposure and then every 2 hours after that as long as sun exposure continues. If swimming or exercising sunscreen should be reapplied every 45 minutes to an hour after getting wet or sweating. One ounce, or the equivalent of a shot glass full of sunscreen, is adequate to protect the skin not covered by a bathing suit. I also recommended a lip balm with a sunscreen as well. Sun protective clothing can be used in lieu of sunscreen but must be worn the entire time you are exposed to the sun's rays.  Important Information  Due to recent changes in healthcare laws, you may see results of your pathology and/or laboratory studies on MyChart before the doctors have had a chance to review them. We understand that in some cases there may be results that are confusing or concerning to you. Please understand that not all results are received at the same time and often the doctors may need to interpret multiple results in order to provide you with the best plan of care or course of treatment. Therefore, we ask that you please give us  2 business days to thoroughly  review all your results before contacting the office for clarification. Should we see a critical lab result, you will be contacted sooner.   If You Need Anything After Your Visit  If you have any questions or concerns for your doctor, please call our main line at [phone] If no one answers, please leave a voicemail as directed and we will return your call as soon as possible. Messages left after 4 pm will be answered the following business day.   You may also send us  a message via MyChart. We typically respond to MyChart messages within 1-2 business days.  For prescription refills, please ask your pharmacy to contact our office. Our fax number is [phone].  If you have an urgent issue when the clinic is closed that cannot wait until the next business day, you can page your doctor at the number below.    Please note that while we do our best to be available for urgent issues outside of office hours, we are not available 24/7.   If you have an urgent issue and are unable to reach us , you may choose to seek medical care at your doctor's office, retail clinic, urgent care center, or emergency room.  If you have a medical emergency, please immediately call 911 or go to the emergency department. In the event of inclement weather, please call our main line at [phone] for an update on the status of any delays or closures.  Dermatology Medication Tips: Please keep the boxes that topical medications come in in  order to help keep track of the instructions about where and how to use these. Pharmacies typically print the medication instructions only on the boxes and not directly on the medication tubes.   If your medication is too expensive, please contact our office at [phone] or send us  a message through MyChart.   We are unable to tell what your co-pay for medications will be in advance as this is different depending on your insurance coverage. However, we may be able to find a  substitute medication at lower cost or fill out paperwork to get insurance to cover a needed medication.   If a prior authorization is required to get your medication covered by your insurance company, please allow us  1-2 business days to complete this process.  Drug prices often vary depending on where the prescription is filled and some pharmacies may offer cheaper prices.  The website www.goodrx.com contains coupons for medications through different pharmacies. The prices here do not account for what the cost may be with help from insurance (it may be cheaper with your insurance), but the website can give you the price if you did not use any insurance.  - You can print the associated coupon and take it with your prescription to the pharmacy.  - You may also stop by our office during regular business hours and pick up a GoodRx coupon card.  - If you need your prescription sent electronically to a different pharmacy, notify our office through [facility] or by phone at [phone]

## 2024-02-05 ENCOUNTER — Telehealth: Payer: Self-pay | Admitting: Family Medicine

## 2024-02-05 NOTE — Telephone Encounter (Signed)
 Copied from CRM [id number]. Topic: Medicare AWV >> Feb 05, 2024 11:08 AM Nathanel DEL wrote: Reason for CRM: Called LVM 02/05/2024 to schedule AWV. Please schedule Virtual or Telehealth visits ONLY.   Nathanel Paschal; Care Guide Ambulatory Clinical Support [place] [facility] Direct Dial: [phone]

## 2024-03-04 ENCOUNTER — Ambulatory Visit (INDEPENDENT_AMBULATORY_CARE_PROVIDER_SITE_OTHER): Admitting: *Deleted

## 2024-03-04 VITALS — Ht 64.0 in | Wt 135.0 lb

## 2024-03-04 DIAGNOSIS — Z0000 Encounter for general adult medical examination without abnormal findings: Secondary | ICD-10-CM

## 2024-03-04 DIAGNOSIS — H6503 Acute serous otitis media, bilateral: Secondary | ICD-10-CM | POA: Diagnosis not present

## 2024-03-04 NOTE — Progress Notes (Signed)
 Subjective:   Karen Gilmore is a 68 y.o. who presents for a Medicare Wellness preventive visit.  As a reminder, Annual Wellness Visits don't include a physical exam, and some assessments may be limited, especially if this visit is performed virtually. We may recommend an in-person follow-up visit with your provider if needed.  Visit Complete: Virtual I connected with  Karen Gilmore on 03/04/24 by a audio enabled telemedicine application and verified that I am speaking with the correct person using two identifiers.  Patient Location: Home  Provider Location: Office/Clinic  I discussed the limitations of evaluation and management by telemedicine. The patient expressed understanding and agreed to proceed.  Vital Signs: Because this visit was a virtual/telehealth visit, some criteria may be missing or patient reported. Any vitals not documented were not able to be obtained and vitals that have been documented are patient reported.  VideoDeclined- This patient declined Librarian, academic. Therefore the visit was completed with audio only.  Persons Participating in Visit: Patient.  AWV Questionnaire: No: Patient Medicare AWV questionnaire was not completed prior to this visit.  Cardiac Risk Factors include: advanced age (>69men, >43 women);dyslipidemia     Objective:    Today's Vitals   03/04/24 0822  Weight: 135 lb (61.2 kg)  Height: 5' 4 (1.626 m)   Body mass index is 23.17 kg/m.     03/04/2024    8:36 AM 09/23/2022    1:24 PM 01/05/2017    1:50 PM  Advanced Directives  Does Patient Have a Medical Advance Directive? Yes No Yes   Type of Advance Directive Living will  Living will  Does patient want to make changes to medical advance directive? No - Patient declined       Data saved with a previous flowsheet row definition    Current Medications (verified) Outpatient Encounter Medications as of 03/04/2024  Medication Sig   fluticasone  (FLONASE )  50 MCG/ACT nasal spray Place 2 sprays into both nostrils daily.   levocetirizine (XYZAL ) 5 MG tablet Take 1 tablet (5 mg total) by mouth every evening.   Multiple Vitamin (MULTIVITAMIN) tablet Take 1 tablet by mouth daily.   nitrofurantoin, macrocrystal-monohydrate, (MACROBID) 100 MG capsule Take 100 mg by mouth daily as needed.   Omega-3 Fatty Acids (FISH OIL) 1000 MG CAPS Take 1 capsule by mouth daily.   [DISCONTINUED] levocetirizine (XYZAL ) 5 MG tablet Take 1 tablet (5 mg total) by mouth every evening.   No facility-administered encounter medications on file as of 03/04/2024.    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   Rectal bleeding    Rectal pain    Past Surgical History:  Procedure Laterality Date   COLONOSCOPY  2006   hemorrhoids;[facility]   DILATION AND CURETTAGE OF UTERUS  1995   G 3 P 2     TUBAL LIGATION     Dr Charlie Aho   Family History  Problem Relation Age of Onset   Breast cancer Mother 17   Heart attack Father 50   Heart disease Father        MI at age 65   Hyperlipidemia Father    Factor V Leiden deficiency Sister    Heart attack Paternal Grandfather        early 99s (NOT definite)   Factor V Leiden deficiency Nephew    Factor  V Leiden deficiency Niece    Factor V Leiden deficiency Niece    Diabetes Neg Hx    Hypertension Neg Hx    Stroke Neg Hx    Colon cancer Neg Hx    Colon polyps Neg Hx    Social History   Socioeconomic History   Marital status: Single    Spouse name: Not on file   Number of children: Not on file   Years of education: Not on file   Highest education level: Master's degree (e.g., MA, MS, MEng, MEd, MSW, MBA)  Occupational History   Occupation: Runner, broadcasting/film/video    Comment: retired 2023  Tobacco Use   Smoking status: Never   Smokeless tobacco: Never  Vaping Use   Vaping  status: Never Used  Substance and Sexual Activity   Alcohol use: Yes    Alcohol/week: 3.0 standard drinks of alcohol    Types: 3 Glasses of wine per week    Comment:  socially   Drug use: No   Sexual activity: Not Currently  Other Topics Concern   Not on file  Social History Narrative   Exercise- Walk, body pump, tennis-  does something everyday   Social Drivers of Health   Financial Resource Strain: Low Risk  (03/04/2024)   Overall Financial Resource Strain (CARDIA)    Difficulty of Paying Living Expenses: Not very hard  Food Insecurity: No Food Insecurity (03/04/2024)   Hunger Vital Sign    Worried About Running Out of Food in the Last Year: Never true    Ran Out of Food in the Last Year: Never true  Transportation Needs: No Transportation Needs (03/04/2024)   PRAPARE - Administrator, Civil Service (Medical): No    Lack of Transportation (Non-Medical): No  Physical Activity: Sufficiently Active (03/04/2024)   Exercise Vital Sign    Days of Exercise per Week: 6 days    Minutes of Exercise per Session: 60 min  Stress: No Stress Concern Present (03/04/2024)   [employer] of Occupational Health - Occupational Stress Questionnaire    Feeling of Stress: Not at all  Social Connections: Moderately Integrated (03/04/2024)   Social Connection and Isolation Panel    Frequency of Communication with Friends and Family: More than three times a week    Frequency of Social Gatherings with Friends and Family: More than three times a week    Attends Religious Services: More than 4 times per year    Active Member of [employer] or Organizations: Yes    Attends Engineer, structural: More than 4 times per year    Marital Status: Never married    Tobacco Counseling Counseling given: Not Answered    Clinical Intake:  Pre-visit preparation completed: Yes  Pain : No/denies pain     BMI - recorded: 23.17 Nutritional Status: BMI of 19-24  Normal Nutritional Risks:  None Diabetes: No  No results found for: HGBA1C   How often do you need to have someone help you when you read instructions, pamphlets, or other written materials from your doctor or pharmacy?: 1 - Never What is the last grade level you completed in school?: Master's degree  Interpreter Needed?: No  Information entered by :: Lolita Libra, CMA   Activities of Daily Living     03/04/2024    8:35 AM  In your present state of health, do you have any difficulty performing the following activities:  Hearing? 1  Vision? 0  Difficulty concentrating or making decisions? 0  Walking or climbing stairs? 0  Dressing or bathing? 0  Doing errands, shopping? 0  Preparing Food and eating ? N  Using the Toilet? N  In the past six months, have you accidently leaked urine? N  Do you have problems with loss of bowel control? N  Managing your Medications? N  Managing your Finances? N  Housekeeping or managing your Housekeeping? N    Patient Care Team: Antonio Meth, Jamee SAUNDERS, DO as PCP - General (Family Medicine) Livingston Rigg, MD as Consulting Physician (Dermatology) de Dasie, Diamond, OD (Optometry) Ob/Gyn, [facility] as Consulting Physician Sarrah Browning, MD as Consulting Physician (Obstetrics and Gynecology)  I have updated your Care Teams any recent Medical Services you may have received from other providers in the past year.     Assessment:   This is a routine wellness examination for Karen Gilmore.  Hearing/Vision screen Hearing Screening - Comments:: Has had slight decrease and is seeing ENT for evaluation.  Vision Screening - Comments:: up to date with routine eye exams with Dr Cammie and will scheduled next exam in September.    Goals Addressed   None    Depression Screen     03/04/2024    8:33 AM 07/27/2023    9:40 AM 09/23/2022    1:17 PM 06/23/2022    9:49 AM 06/03/2021    8:30 AM 05/28/2020    1:45 PM 01/11/2018    8:41 AM  PHQ 2/9 Scores  PHQ - 2 Score 0 0 0 0 0  0 0  PHQ- 9 Score   0        Fall Risk     03/04/2024    8:29 AM 07/27/2023    9:39 AM 09/23/2022    1:15 PM 06/23/2022    9:48 AM 06/03/2021    8:30 AM  Fall Risk   Falls in the past year? 0 0 0 0 0  Number falls in past yr: 0 0 0 0 0  Injury with Fall? 0 0 0 0 0  Risk for fall due to : No Fall Risks  No Fall Risks    Follow up Education provided Falls evaluation completed Falls evaluation completed Falls evaluation completed  Falls evaluation completed      Data saved with a previous flowsheet row definition    MEDICARE RISK AT HOME:  Medicare Risk at Home Any stairs in or around the home?: Yes If so, are there any without handrails?: No Home free of loose throw rugs in walkways, pet beds, electrical cords, etc?: Yes Adequate lighting in your home to reduce risk of falls?: Yes Life alert?: No Use of a cane, walker or w/c?: Yes Grab bars in the bathroom?: No Shower chair or bench in shower?: No Elevated toilet seat or a handicapped toilet?: No  TIMED UP AND GO:  Was the test performed?  No,audio  Cognitive Function: 6CIT completed    09/23/2022    1:18 PM  MMSE - Mini Mental State Exam  Orientation to time 5  Orientation to Place 5  Registration 3  Attention/ Calculation 5  Recall 3  Language- name 2 objects 2  Language- repeat 1  Language- follow 3 step command 3  Language- read & follow direction 1  Write a sentence 1  Copy design 1  Total score 30        03/04/2024    8:38 AM 09/23/2022    1:16 PM  6CIT Screen  What Year? 0 points 0 points  What month? 0 points 0 points  What time? 0 points 0 points  Count back from 20 0 points 0 points  Months in reverse 0 points 0 points  Repeat phrase 2 points 0 points  Total Score 2 points 0 points    Immunizations Immunization History  Administered Date(s) Administered   Influenza Split 05/11/2011, 05/28/2012   Influenza Whole 04/29/2010, 04/01/2013   Influenza,inj,Quad PF,6+ Mos 07/14/2014, 05/28/2020,  06/03/2021, 06/17/2022   Influenza,inj,quad, With Preservative 04/20/2019   Influenza-Unspecified 04/17/2013, 04/17/2016, 03/26/2023   Moderna Covid-19 Seasonal Vaccine 6 months thru 66years of age 66/02/2023   Moderna Sars-Covid-2 Vaccination 01/08/2021   PFIZER(Purple Top)SARS-COV-2 Vaccination 09/14/2019, 10/05/2019, 05/13/2020   PNEUMOCOCCAL CONJUGATE-20 09/23/2022   RSV,unspecified 04/08/2023   Tdap 06/12/2013, 03/29/2023   Zoster Recombinant(Shingrix ) 01/05/2017, 03/29/2017    Screening Tests Health Maintenance  Topic Date Due   COVID-19 Vaccine (5 - 2024-25 season) 05/21/2023   INFLUENZA VACCINE  02/16/2024   MAMMOGRAM  07/23/2024   Medicare Annual Wellness (AWV)  03/04/2025   Colonoscopy  12/20/2029   DTaP/Tdap/Td (3 - Td or Tdap) 03/28/2033   Pneumococcal Vaccine: 50+ Years  Completed   DEXA SCAN  Completed   Hepatitis C Screening  Completed   Zoster Vaccines- Shingrix   Completed   HPV VACCINES  Aged Out   Meningococcal B Vaccine  Aged Out   Pneumococcal Vaccine  Discontinued   Fecal DNA (Cologuard)  Discontinued    Health Maintenance  Health Maintenance Due  Topic Date Due   COVID-19 Vaccine (5 - 2024-25 season) 05/21/2023   INFLUENZA VACCINE  02/16/2024   Health Maintenance Items Addressed: Will get flu and COVID vaccines at pharmacy.  Additional Screening:  Vision Screening: Recommended annual ophthalmology exams for early detection of glaucoma and other disorders of the eye. Would you like a referral to an eye doctor? No   Dental Screening: Recommended annual dental exams for proper oral hygiene  Community Resource Referral / Chronic Care Management: CRR required this visit?  No   CCM required this visit?  No   Plan:    I have personally reviewed and noted the following in the patient's chart:   Medical and social history Use of alcohol, tobacco or illicit drugs  Current medications and supplements including opioid prescriptions. Patient is  not currently taking opioid prescriptions. Functional ability and status Nutritional status Physical activity Advanced directives List of other physicians Hospitalizations, surgeries, and ER visits in previous 12 months Vitals Screenings to include cognitive, depression, and falls Referrals and appointments  In addition, I have reviewed and discussed with patient certain preventive protocols, quality metrics, and best practice recommendations. A written personalized care plan for preventive services as well as general preventive health recommendations were provided to patient.   Lolita Libra, CMA   03/04/2024   After Visit Summary: (MyChart) Due to this being a telephonic visit, the after visit summary with patients personalized plan was offered to patient via MyChart   Notes: Nothing significant to report at this time.

## 2024-03-04 NOTE — Patient Instructions (Addendum)
 Ms. Lottman , Thank you for taking time out of your busy schedule to complete your Annual Wellness Visit with me. I enjoyed our conversation and look forward to speaking with you again next year. I, as well as your care team,  appreciate your ongoing commitment to your health goals. Please review the following plan we discussed and let me know if I can assist you in the future. Your Game plan/ To Do List    Follow up Visits: Next Medicare AWV with our clinical staff: 03/05/25 8:20am    Next Office Visit with your provider: 07/29/24 10am  Clinician Recommendations:  Aim for 30 minutes of exercise or brisk walking, 6-8 glasses of water, and 5 servings of fruits and vegetables each day.   You will need to get the following vaccines at your local pharmacy: flu and COVID.     This is a list of the screening recommended for you and due dates:  Health Maintenance  Topic Date Due   COVID-19 Vaccine (5 - 2024-25 season) 05/21/2023   Medicare Annual Wellness Visit  09/23/2023   Flu Shot  02/16/2024   Mammogram  07/23/2024   Colon Cancer Screening  12/20/2029   DTaP/Tdap/Td vaccine (3 - Td or Tdap) 03/28/2033   Pneumococcal Vaccine for age over 48  Completed   DEXA scan (bone density measurement)  Completed   Hepatitis C Screening  Completed   Zoster (Shingles) Vaccine  Completed   HPV Vaccine  Aged Out   Meningitis B Vaccine  Aged Out   Pneumococcal Vaccine  Discontinued   Cologuard (Stool DNA test)  Discontinued    Advanced directives: (Copy Requested) Please bring a copy of your health care power of attorney and living will to the office to be added to your chart at your convenience. You can mail to [facility] [address] [place], [place] [address] or email to [email or web address] Advance Care Planning is important because it:  [x]  Makes sure you receive the medical care that is consistent with your values, goals, and preferences  [x]  It provides guidance to  your family and loved ones and reduces their decisional burden about whether or not they are making the right decisions based on your wishes.  Follow the link provided in your after visit summary or read over the paperwork we have mailed to you to help you started getting your Advance Directives in place. If you need assistance in completing these, please reach out to us  so that we can help you!  See attachments for Preventive Care and Fall Prevention Tips.

## 2024-03-05 ENCOUNTER — Ambulatory Visit

## 2024-03-06 ENCOUNTER — Ambulatory Visit (INDEPENDENT_AMBULATORY_CARE_PROVIDER_SITE_OTHER): Admitting: Audiology

## 2024-03-06 ENCOUNTER — Encounter (INDEPENDENT_AMBULATORY_CARE_PROVIDER_SITE_OTHER): Payer: Self-pay | Admitting: Physician Assistant

## 2024-03-06 ENCOUNTER — Ambulatory Visit (INDEPENDENT_AMBULATORY_CARE_PROVIDER_SITE_OTHER): Admitting: Physician Assistant

## 2024-03-06 VITALS — BP 121/82 | HR 52

## 2024-03-06 DIAGNOSIS — H903 Sensorineural hearing loss, bilateral: Secondary | ICD-10-CM | POA: Diagnosis not present

## 2024-03-06 DIAGNOSIS — H905 Unspecified sensorineural hearing loss: Secondary | ICD-10-CM | POA: Diagnosis not present

## 2024-03-06 DIAGNOSIS — H6990 Unspecified Eustachian tube disorder, unspecified ear: Secondary | ICD-10-CM

## 2024-03-06 DIAGNOSIS — H6991 Unspecified Eustachian tube disorder, right ear: Secondary | ICD-10-CM

## 2024-03-06 NOTE — Progress Notes (Signed)
 Dear Dr. Antonio Meth, Here is my assessment for our mutual patient, Karen Gilmore. Thank you for allowing me the opportunity to care for your patient. Please do not hesitate to contact me should you have any other questions. Sincerely, Chyrl Cohen PA-C  Otolaryngology Clinic Note Referring provider: Dr. Antonio Meth HPI:  Karen Gilmore is a 68 y.o. female kindly referred by Dr. Antonio Meth   The patient is a 68 year old female seen in our office for evaluation of intermittent ear fullness.  She notes over the last 6 months she has had intermittent fullness of the right ear, she denies any significant pain.  She feels the sensation that her ear needs to pop, she notes that when she yawns or stretches she feels the relief and hearing is back to normal.  She notes this this is not significantly bothersome and is not even a daily occurrence.  She notes a history of allergies uses Flonase  occasionally and antihistamine as needed.  She saw her primary care provider who recommended increasing her Flonase  to daily and continue daily antihistamine.  She also notes a history of generalized decreased hearing bilateral, worse in noisy environments.  She denies any significant ear trauma.     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   Rectal bleeding    Rectal pain      Past Surgical History:  Procedure Laterality Date   COLONOSCOPY  2006   hemorrhoids;[facility]   DILATION AND CURETTAGE OF UTERUS  1995   G 3 P 2     TUBAL LIGATION     Dr Charlie Aho    Family History  Problem Relation Age of Onset   Breast cancer Mother 37   Heart attack Father 63   Heart disease Father        MI at age 31   Hyperlipidemia Father    Factor V Leiden deficiency Sister    Heart attack Paternal Grandfather         early 26s (NOT definite)   Factor V Leiden deficiency Nephew    Factor V Leiden deficiency Niece    Factor V Leiden deficiency Niece    Diabetes Neg Hx    Hypertension Neg Hx    Stroke Neg Hx    Colon cancer Neg Hx    Colon polyps Neg Hx      Social Connections: Moderately Integrated (03/04/2024)   Social Connection and Isolation Panel    Frequency of Communication with Friends and Family: More than three times a week    Frequency of Social Gatherings with Friends and Family: More than three times a week    Attends Religious Services: More than 4 times per year    Active Member of Clubs or Organizations: Yes    Attends Engineer, structural: More than 4 times per year    Marital Status: Never married      Current Outpatient Medications:    fluticasone  (FLONASE ) 50 MCG/ACT nasal spray, Place 2 sprays into both nostrils daily., Disp: 16 g, Rfl: 5   levocetirizine (XYZAL ) 5 MG tablet, Take 1 tablet (5 mg total) by mouth every evening., Disp: , Rfl:    Multiple Vitamin (MULTIVITAMIN) tablet, Take 1 tablet by mouth daily., Disp: , Rfl:    nitrofurantoin, macrocrystal-monohydrate, (MACROBID) 100 MG capsule,  Take 100 mg by mouth daily as needed., Disp: , Rfl:    Omega-3 Fatty Acids (FISH OIL) 1000 MG CAPS, Take 1 capsule by mouth daily., Disp: , Rfl:    Physical Exam:   BP 121/82   Pulse (!) 52   SpO2 97%   Pertinent Findings  CN II-XII intact Bilateral EAC clear and TM intact with well pneumatized middle ear spaces Weber 512: equal Rinne 512: AC > BC b/l  Anterior rhinoscopy: Septum midline; bilateral inferior turbinates with no hypertrophy No lesions of oral cavity/oropharynx; dentition wnl No obviously palpable neck masses/lymphadenopathy/thyromegaly No respiratory distress or stridor  Seprately Identifiable Procedures:  None  Impression & Plans:  Karen Gilmore is a 68 y.o. female with the following   Eustachian tube dysfunction-  I agree with diagnosis and  management from Dr. Cyndee.  Being consistent with the Flonase  and daily antihistamine would be beneficial for eustachian tube dysfunction.  Fortunately her symptoms are not severe and not significantly bothersome.  So medical management should be sufficient.  If she finds that the symptoms are more severe or worsen I like to see her back in the office for repeat evaluation.  Sensorineural hearing loss.   The patient has sensorineural hearing loss without any significant issues.  There is a very minimal asymmetry in the higher frequencies.  I would recommend the patient continue to monitor for any changes in her hearing and return as needed.  Otherwise follow-up audiogram in 2 years.  The patient may return at any point in the future.  She verbalized understanding and agreement to today's plan had no further questions or concerns.   - f/u PRN   Thank you for allowing me the opportunity to care for your patient. Please do not hesitate to contact me should you have any other questions.  Sincerely, Chyrl Cohen PA-C [facility] Phone: [phone] Fax: [phone]  03/06/2024, 10:38 AM

## 2024-03-06 NOTE — Progress Notes (Signed)
  [address] [place], [place] [address] [phone]  Audiological Evaluation    Name: Karen Gilmore     DOB:   [birth year]      MRN:   [id number]                                                                                     Service Date: 03/06/2024     Accompanied by: unaccompanied   Patient comes today after Reyes Cohen, PA-C sent a referral for a hearing evaluation due to concerns with hearing loss.   Symptoms Yes Details  Hearing loss  [x]  For some time has perceived that has some trouble hearing, in the past 3-4 months she has noticed right ear to be worse.  Tinnitus  []    Ear pain/ infections/pressure  []    Balance problems  []    Noise exposure history  []    Previous ear surgeries  []    Family history of hearing loss  []    Amplification  []    Other  []      Otoscopy: Right ear: Clear external ear canal and notable landmarks visualized on the tympanic membrane. Left ear:  Clear external ear canal and notable landmarks visualized on the tympanic membrane.  Tympanometry: Right ear: Type A- Normal external ear canal volume with normal middle ear pressure and tympanic membrane compliance. Left ear: Type A- Normal external ear canal volume with normal middle ear pressure and tympanic membrane compliance.  Pure tone Audiometry: Right ear- Normal hearing from [phone] Hz, then mild to moderate presumably sensorineural hearing loss from 6000 Hz - 8000 Hz. Left ear-  Normal hearing from [phone], then mild presumably sensorineural hearing loss at 8000 Hz.  Speech Audiometry: Right ear- Speech Reception Threshold (SRT) was obtained at 10 dBHL. Left ear-Speech Reception Threshold (SRT) was obtained at 10 dBHL.   Word Recognition Score Tested using NU-6 (recorded) Right ear: 100% was obtained at a presentation level of 55 dBHL with contralateral masking which is deemed as  excellent. Left ear: 100% was obtained at a presentation level of 55 dBHL with  contralateral masking which is deemed as  excellent.   The hearing test results were completed under headphones and results are deemed to be of good reliability. Test technique:  conventional     Recommendations: Follow up with ENT as scheduled for today. Return for a hearing evaluation if concerns with hearing changes arise or per MD recommendation.   Othniel Maret MARIE LEROUX-MARTINEZ, AUD

## 2024-03-26 DIAGNOSIS — K080 Exfoliation of teeth due to systemic causes: Secondary | ICD-10-CM | POA: Diagnosis not present

## 2024-05-10 DIAGNOSIS — N1330 Unspecified hydronephrosis: Secondary | ICD-10-CM | POA: Diagnosis not present

## 2024-05-10 DIAGNOSIS — R10A0 Flank pain, unspecified side: Secondary | ICD-10-CM | POA: Diagnosis not present

## 2024-05-10 DIAGNOSIS — K5900 Constipation, unspecified: Secondary | ICD-10-CM | POA: Diagnosis not present

## 2024-05-10 DIAGNOSIS — K828 Other specified diseases of gallbladder: Secondary | ICD-10-CM | POA: Diagnosis not present

## 2024-05-10 DIAGNOSIS — R109 Unspecified abdominal pain: Secondary | ICD-10-CM | POA: Diagnosis not present

## 2024-05-10 DIAGNOSIS — N2882 Megaloureter: Secondary | ICD-10-CM | POA: Diagnosis not present

## 2024-05-10 DIAGNOSIS — N131 Hydronephrosis with ureteral stricture, not elsewhere classified: Secondary | ICD-10-CM | POA: Diagnosis not present

## 2024-05-13 ENCOUNTER — Ambulatory Visit: Payer: Self-pay

## 2024-05-13 NOTE — Telephone Encounter (Signed)
**Note De-identified  [place] Obfuscation** Please advise 

## 2024-05-13 NOTE — Telephone Encounter (Signed)
 FYI Only or Action Required?: Action required by provider: referral request.  Patient was last seen in primary care on 01/09/2024 by Antonio Meth, Jamee SAUNDERS, DO.  Called Nurse Triage reporting Urinary Frequency.  Symptoms began several days ago.  Interventions attempted: Prescription medications: Antibiotics and medication for pain.  Symptoms are: unchanged.  Triage Disposition: See Physician Within 24 Hours  Patient/caregiver understands and will follow disposition?: Yes      Copied from CRM #[id number]. Topic: Clinical - Red Word Triage >> May 13, 2024  9:09 AM Berwyn MATSU wrote: Red Word that prompted transfer to Nurse Triage: uti symptoms, pain in the back right side and, discomfort with fqy urination and lots discomfort Reason for Disposition  Age > 50 years  Answer Assessment - Initial Assessment Questions Pt states she went to ED for symptoms in [facility]. Told to follow up with urology for symptoms. Patient on medication for symptoms and states she is still having continuing pain. Patient already scheduled for a hospital follow up on 11/6 and requesting an appointment sooner in office. Called [facility] to see if she could be seen sooner and pt scheduled on 11/3. Patient requesting a urology referral due to symptoms.     1. SEVERITY: How bad is the pain?  (e.g., Scale 1-10; mild, moderate, or severe)     Severe  2. FREQUENCY: How many times have you had painful urination today?      Once  3. PATTERN: Is pain present every time you urinate or just sometimes?      Most times yes  4. ONSET: When did the painful urination start?      Several days ago  5. FEVER: Do you have a fever? If Yes, ask: What is your temperature, how was it measured, and when did it start?     Denies      6. CAUSE: What do you think is causing the painful urination?  (e.g., UTI, scratch, Herpes sore)     UTI  7. OTHER SYMPTOMS: Do you have any other symptoms? (e.g., blood in urine, flank pain, genital  sores, urgency, vaginal discharge)     R side back pain, discomfort, nausea, vomiting.  Protocols used: Urination Pain - Female-A-AH

## 2024-05-14 ENCOUNTER — Other Ambulatory Visit: Payer: Self-pay | Admitting: Medical Genetics

## 2024-05-15 DIAGNOSIS — N1330 Unspecified hydronephrosis: Secondary | ICD-10-CM | POA: Diagnosis not present

## 2024-05-15 DIAGNOSIS — R1010 Upper abdominal pain, unspecified: Secondary | ICD-10-CM | POA: Diagnosis not present

## 2024-05-16 ENCOUNTER — Other Ambulatory Visit: Payer: Self-pay | Admitting: Urology

## 2024-05-20 ENCOUNTER — Encounter: Payer: Self-pay | Admitting: Family Medicine

## 2024-05-20 ENCOUNTER — Ambulatory Visit: Admitting: Family Medicine

## 2024-05-20 VITALS — BP 118/76 | HR 74 | Temp 98.4°F | Resp 18 | Ht 64.0 in | Wt 137.4 lb

## 2024-05-20 DIAGNOSIS — N390 Urinary tract infection, site not specified: Secondary | ICD-10-CM

## 2024-05-20 LAB — POC URINALSYSI DIPSTICK (AUTOMATED)
Bilirubin, UA: NEGATIVE
Blood, UA: NEGATIVE
Glucose, UA: NEGATIVE
Ketones, UA: NEGATIVE
Leukocytes, UA: NEGATIVE
Nitrite, UA: NEGATIVE
Protein, UA: NEGATIVE
Spec Grav, UA: <=1.005 — AB (ref 1.010–1.025)
Urobilinogen, UA: 0.2 U/dL
pH, UA: 6 (ref 5.0–8.0)

## 2024-05-20 NOTE — Progress Notes (Unsigned)
 Subjective:    Patient ID: Karen Gilmore, female    DOB: [birth year], 68 y.o.   MRN: [id number]  Chief Complaint  Patient presents with   Urinary Frequency    Pt states she was in [facility] in the ED and was Dx with a UTI. Pt was given antibiotic. Pt states still having sxs.     HPI Patient is in today for urinary frequency.  Discussed the use of AI scribe software for clinical note transcription with the patient, who gave verbal consent to proceed.  History of Present Illness Karen Gilmore is a 68 year old female who presents with a history of urinary tract infection and ureteral kinks.  While on a trip to [place], she experienced ear and back pain, nausea, and vomiting, which led her to visit the emergency room. She was diagnosed with a urinary tract infection (UTI) despite not having dysuria, although she did have increased urgency to urinate.  In the emergency room, imaging studies were conducted to rule out a kidney stone, which were negative. However, the scans revealed kinks in her ureter at multiple locations, an infection in her bladder, and swelling in one of her kidneys. Her pain was managed, and she was advised to follow up with a urologist.  Upon returning home, she faced challenges scheduling a timely appointment with her primary care provider and sought care through a physical therapist she had seen previously for pelvic floor exercises. The urologist in [place]  noted that there was not a total blockage in the ureter as there was still some flow.  She was treated with antibiotics in [place] , which resolved the UTI, as confirmed by a clear urine sample taken during her recent urology visit.    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   Rectal bleeding    Rectal pain     Past Surgical History:  Procedure Laterality Date   COLONOSCOPY  2006   hemorrhoids;[facility]   DILATION AND CURETTAGE OF UTERUS   1995   G 3 P 2     TUBAL LIGATION     Dr Charlie Aho    Family History  Problem Relation Age of Onset   Breast cancer Mother 20   Heart attack Father 45   Heart disease Father        MI at age 3   Hyperlipidemia Father    Factor V Leiden deficiency Sister    Heart attack Paternal Grandfather        early 18s (NOT definite)   Factor V Leiden deficiency Nephew    Factor V Leiden deficiency Niece    Factor V Leiden deficiency Niece    Diabetes Neg Hx    Hypertension Neg Hx    Stroke Neg Hx    Colon cancer Neg Hx    Colon polyps Neg Hx     Social History   Socioeconomic History   Marital status: Single    Spouse name: Not on file   Number of children: Not on file   Years of education: Not on file   Highest education level: Master's degree (e.g., MA, MS, MEng, MEd, MSW, MBA)  Occupational History   Occupation: runner, broadcasting/film/video    Comment: retired 2023  Tobacco Use   Smoking status: Never   Smokeless tobacco: Never  Vaping Use   Vaping status: Never Used  Substance and Sexual Activity   Alcohol use: Yes  Alcohol/week: 3.0 standard drinks of alcohol    Types: 3 Glasses of wine per week    Comment:  socially   Drug use: No   Sexual activity: Not Currently  Other Topics Concern   Not on file  Social History Narrative   Exercise- Walk, body pump, tennis-  does something everyday   Social Drivers of Health   Financial Resource Strain: Low Risk  (03/04/2024)   Overall Financial Resource Strain (CARDIA)    Difficulty of Paying Living Expenses: Not very hard  Food Insecurity: No Food Insecurity (03/04/2024)   Hunger Vital Sign    Worried About Running Out of Food in the Last Year: Never true    Ran Out of Food in the Last Year: Never true  Transportation Needs: No Transportation Needs (03/04/2024)   PRAPARE - Administrator, Civil Service (Medical): No    Lack of Transportation (Non-Medical): No  Physical Activity: Sufficiently Active (03/04/2024)   Exercise  Vital Sign    Days of Exercise per Week: 6 days    Minutes of Exercise per Session: 60 min  Stress: No Stress Concern Present (03/04/2024)   [employer] of Occupational Health - Occupational Stress Questionnaire    Feeling of Stress: Not at all  Social Connections: Moderately Integrated (03/04/2024)   Social Connection and Isolation Panel    Frequency of Communication with Friends and Family: More than three times a week    Frequency of Social Gatherings with Friends and Family: More than three times a week    Attends Religious Services: More than 4 times per year    Active Member of [employer] or Organizations: Yes    Attends Banker Meetings: More than 4 times per year    Marital Status: Never married  Intimate Partner Violence: Not At Risk (03/04/2024)   Humiliation, Afraid, Rape, and Kick questionnaire    Fear of Current or Ex-Partner: No    Emotionally Abused: No    Physically Abused: No    Sexually Abused: No    Outpatient Medications Prior to Visit  Medication Sig Dispense Refill   fluticasone  (FLONASE ) 50 MCG/ACT nasal spray Place 2 sprays into both nostrils daily. 16 g 5   levocetirizine (XYZAL ) 5 MG tablet Take 1 tablet (5 mg total) by mouth every evening.     Multiple Vitamin (MULTIVITAMIN) tablet Take 1 tablet by mouth daily.     Omega-3 Fatty Acids (FISH OIL) 1000 MG CAPS Take 1 capsule by mouth daily.     nitrofurantoin, macrocrystal-monohydrate, (MACROBID) 100 MG capsule Take 100 mg by mouth daily as needed.     No facility-administered medications prior to visit.    Allergies  Allergen Reactions   Chlorhexidine Gluconate Itching and Rash   Hydrochlorothiazide Rash    Review of Systems  Constitutional:  Negative for fever and malaise/fatigue.  HENT:  Negative for congestion.   Eyes:  Negative for blurred vision.  Respiratory:  Negative for shortness of breath.   Cardiovascular:  Negative for chest pain, palpitations and leg swelling.   Gastrointestinal:  Negative for abdominal pain, blood in stool and nausea.  Genitourinary:  Negative for dysuria and frequency.  Musculoskeletal:  Negative for falls.  Skin:  Negative for rash.  Neurological:  Negative for dizziness, loss of consciousness and headaches.  Endo/Heme/Allergies:  Negative for environmental allergies.  Psychiatric/Behavioral:  Negative for depression. The patient is not nervous/anxious.        Objective:    Physical Exam Vitals and  nursing note reviewed.  Constitutional:      General: She is not in acute distress.    Appearance: Normal appearance. She is well-developed.  HENT:     Head: Normocephalic and atraumatic.  Eyes:     General: No scleral icterus.       Right eye: No discharge.        Left eye: No discharge.  Cardiovascular:     Rate and Rhythm: Normal rate and regular rhythm.     Heart sounds: No murmur heard. Pulmonary:     Effort: Pulmonary effort is normal. No respiratory distress.     Breath sounds: Normal breath sounds.  Musculoskeletal:        General: Normal range of motion.     Cervical back: Normal range of motion and neck supple.     Right lower leg: No edema.     Left lower leg: No edema.  Skin:    General: Skin is warm and dry.  Neurological:     Mental Status: She is alert and oriented to person, place, and time.  Psychiatric:        Mood and Affect: Mood normal.        Behavior: Behavior normal.        Thought Content: Thought content normal.        Judgment: Judgment normal.     BP 118/76 (BP Location: Left Arm, Patient Position: Sitting, Cuff Size: Normal)   Pulse 74   Temp 98.4 F (36.9 C) (Oral)   Resp 18   Ht 5' 4 (1.626 m)   Wt 137 lb 6.4 oz (62.3 kg)   SpO2 98%   BMI 23.58 kg/m  Wt Readings from Last 3 Encounters:  05/20/24 137 lb 6.4 oz (62.3 kg)  03/04/24 135 lb (61.2 kg)  01/09/24 139 lb 3.2 oz (63.1 kg)    Diabetic Foot Exam - Simple   No data filed    Lab Results  Component Value Date    WBC 5.9 07/27/2023   HGB 14.1 07/27/2023   HCT 42.3 07/27/2023   PLT 274.0 07/27/2023   GLUCOSE 84 07/27/2023   CHOL 238 (H) 07/27/2023   TRIG 54.0 07/27/2023   HDL 71.10 07/27/2023   LDLDIRECT 137.7 06/12/2013   LDLCALC 156 (H) 07/27/2023   ALT 16 07/27/2023   AST 21 07/27/2023   NA 137 07/27/2023   K 4.7 07/27/2023   CL 100 07/27/2023   CREATININE 0.78 07/27/2023   BUN 18 07/27/2023   CO2 29 07/27/2023   TSH 2.01 07/27/2023    Lab Results  Component Value Date   TSH 2.01 07/27/2023   Lab Results  Component Value Date   WBC 5.9 07/27/2023   HGB 14.1 07/27/2023   HCT 42.3 07/27/2023   MCV 96.0 07/27/2023   PLT 274.0 07/27/2023   Lab Results  Component Value Date   NA 137 07/27/2023   K 4.7 07/27/2023   CO2 29 07/27/2023   GLUCOSE 84 07/27/2023   BUN 18 07/27/2023   CREATININE 0.78 07/27/2023   BILITOT 0.5 07/27/2023   ALKPHOS 97 07/27/2023   AST 21 07/27/2023   ALT 16 07/27/2023   PROT 7.0 07/27/2023   ALBUMIN 4.5 07/27/2023   CALCIUM 9.5 07/27/2023   GFR 79.46 07/27/2023   Lab Results  Component Value Date   CHOL 238 (H) 07/27/2023   Lab Results  Component Value Date   HDL 71.10 07/27/2023   Lab Results  Component Value Date  LDLCALC 156 (H) 07/27/2023   Lab Results  Component Value Date   TRIG 54.0 07/27/2023   Lab Results  Component Value Date   CHOLHDL 3 07/27/2023   No results found for: HGBA1C     Assessment & Plan:  Urinary tract infection without hematuria, site unspecified -     POCT Urinalysis Dipstick (Automated) -     Urine Culture  Assessment and Plan Assessment & Plan Ureteral kinking with hydronephrosis   Diagnosed in [place]  with ureteral kinking and hydronephrosis, she presents with nausea, vomiting, and back pain. Imaging revealed ureteral kinking and kidney swelling without nephrolithiasis. A urologist confirmed partial obstruction with some urine flow. Perform cystoscopy on November 19th. Place a ureteral stent  to assess and manage potential obstruction. Monitor kidney function and ureteral flow.  Urinary tract infection   She was diagnosed with a UTI during the [place]  trip and treated with antibiotics, resulting in resolution. A recent urine test confirmed clearance with no current symptoms.    Ahja Martello R Lowne Chase, DO

## 2024-05-21 ENCOUNTER — Ambulatory Visit: Payer: Self-pay | Admitting: Family Medicine

## 2024-05-21 LAB — URINE CULTURE
MICRO NUMBER:: 17182146
Result:: NO GROWTH
SPECIMEN QUALITY:: ADEQUATE

## 2024-05-23 ENCOUNTER — Other Ambulatory Visit (INDEPENDENT_AMBULATORY_CARE_PROVIDER_SITE_OTHER)

## 2024-05-23 ENCOUNTER — Inpatient Hospital Stay: Admitting: Family Medicine

## 2024-05-23 DIAGNOSIS — Z006 Encounter for examination for normal comparison and control in clinical research program: Secondary | ICD-10-CM

## 2024-05-27 NOTE — Patient Instructions (Signed)
 SURGICAL WAITING ROOM VISITATION Patients having surgery or a procedure may have no more than 2 support people in the waiting area - these visitors may rotate in the visitor waiting room.   Due to an increase in RSV and influenza rates and associated hospitalizations, children ages 48 and under may not visit patients in [facility] hospitals. If the patient needs to stay at the hospital during part of their recovery, the visitor guidelines for inpatient rooms apply.  PRE-OP VISITATION  Pre-op nurse will coordinate an appropriate time for 1 support person to accompany the patient in pre-op.  This support person may not rotate.  This visitor will be contacted when the time is appropriate for the visitor to come back in the pre-op area.  Please refer to the [facility] website for the visitor guidelines for Inpatients (after your surgery is over and you are in a regular room).  You are not required to quarantine at this time prior to your surgery. However, you must do this: Hand Hygiene often Do NOT share personal items Notify your provider if you are in close contact with someone who has COVID or you develop fever 100.4 or greater, new onset of sneezing, cough, sore throat, shortness of breath or body aches.  If you test positive for Covid or have been in contact with anyone that has tested positive in the last 10 days please notify you surgeon.    Your procedure is scheduled on:  06/05/24  Report to [facility] Main Entrance: [place] entrance where the [employer] is available.   Report to admitting at: 9:05 AM  Call this number if you have any questions or problems the morning of surgery [phone]  FOLLOW ANY ADDITIONAL PRE OP INSTRUCTIONS YOU RECEIVED FROM YOUR SURGEON'S OFFICE!!!  Do not eat food or drink fluids after Midnight the night prior to your surgery/procedure.   Oral Hygiene is also important to reduce your risk of infection.        Remember - BRUSH YOUR TEETH  THE MORNING OF SURGERY WITH YOUR REGULAR TOOTHPASTE  Do NOT smoke after Midnight the night before surgery.  STOP TAKING all Vitamins, Herbs and supplements 1 week before your surgery.   Take ONLY these medicines the morning of surgery with A SIP OF WATER: NONE.                   You may not have any metal on your body including hair pins, jewelry, and body piercing  Do not wear make-up, lotions, powders, perfumes / cologne, or deodorant  Do not wear nail polish including gel and S&S, artificial / acrylic nails, or any other type of covering on natural nails including finger and toenails. If you have artificial nails, gel coating, etc., that needs to be removed by a nail salon, Please have this removed prior to surgery. Not doing so may mean that your surgery could be cancelled or delayed if the Surgeon or anesthesia staff feels like they are unable to monitor you safely.   Do not shave 48 hours prior to surgery to avoid nicks in your skin which may contribute to postoperative infections.   Contacts, Hearing Aids, dentures or bridgework may not be worn into surgery. DENTURES WILL BE REMOVED PRIOR TO SURGERY PLEASE DO NOT APPLY Poly grip OR ADHESIVES!!!  You may bring a small overnight bag with you on the day of surgery, only pack items that are not valuable. [facility] IS NOT RESPONSIBLE  FOR VALUABLES THAT ARE LOST OR STOLEN.   Patients discharged on the day of surgery will not be allowed to drive home.  Someone NEEDS to stay with you for the first 24 hours after anesthesia.  Do not bring your home medications to the hospital. The Pharmacy will dispense medications listed on your medication list to you during your admission in the Hospital.  Special Instructions: Bring a copy of your healthcare power of attorney and living will documents the day of surgery, if you wish to have them scanned into your [facility] Records- EPIC  Please read over the following fact sheets you were  given: IF YOU HAVE QUESTIONS ABOUT YOUR PRE-OP INSTRUCTIONS, PLEASE CALL [phone]   [facility] - Preparing for Surgery      Before surgery, you can play an important role.  Because skin is not sterile, your skin needs to be as free of germs as possible.  You can reduce the number of germs on your skin by washing with CHG (chlorahexidine gluconate) soap before surgery.  CHG is an antiseptic cleaner which kills germs and bonds with the skin to continue killing germs even after washing. Please DO NOT use if you have an allergy to CHG or antibacterial soaps.  If your skin becomes reddened/irritated stop using the CHG and inform your nurse when you arrive at Short Stay. Do not shave (including legs and underarms) for at least 48 hours prior to the first CHG shower.  You may shave your face/neck.  Please follow these instructions carefully:  1.  Shower with CHG Soap the night before surgery ONLY (DO NOT USE THE SOAP THE MORNING OF SURGERY).  2.  If you choose to wash your hair, wash your hair first as usual with your normal  shampoo.  3.  After you shampoo, rinse your hair and body thoroughly to remove the shampoo.                             4.  Use CHG as you would any other liquid soap.  You can apply chg directly to the skin and wash.  Gently with a scrungie or clean washcloth.  5.  Apply the CHG Soap to your body ONLY FROM THE NECK DOWN.   Do not use on face/ open                           Wound or open sores. Avoid contact with eyes, ears mouth and genitals (private parts).                       Wash face,  Genitals (private parts) with your normal soap.             6.  Wash thoroughly, paying special attention to the area where your  surgery  will be performed.  7.  Thoroughly rinse your body with warm water from the neck down.  8.  DO NOT shower/wash with your normal soap after using and rinsing off the CHG Soap.                9.  Pat yourself dry with a clean towel.            10.  Wear  clean pajamas.            11.  Place clean sheets on your bed the night of  your first shower and do not  sleep with pets.  Day of Surgery : Do not apply any CHG, lotions/deodorants the morning of surgery.  Please wear clean clothes to the hospital/surgery center.   FAILURE TO FOLLOW THESE INSTRUCTIONS MAY RESULT IN THE CANCELLATION OF YOUR SURGERY  PATIENT SIGNATURE_________________________________  NURSE SIGNATURE__________________________________  ________________________________________________________________________

## 2024-05-28 ENCOUNTER — Encounter (HOSPITAL_COMMUNITY)
Admission: RE | Admit: 2024-05-28 | Discharge: 2024-05-28 | Disposition: A | Discharge status: Home / Self Care | Source: Ambulatory Visit | Attending: Urology | Admitting: Urology

## 2024-05-28 ENCOUNTER — Other Ambulatory Visit: Payer: Self-pay

## 2024-05-28 ENCOUNTER — Encounter (HOSPITAL_COMMUNITY): Payer: Self-pay

## 2024-05-28 VITALS — BP 137/84 | HR 70 | Temp 98.3°F | Ht 64.0 in | Wt 136.0 lb

## 2024-05-28 DIAGNOSIS — Z01818 Encounter for other preprocedural examination: Secondary | ICD-10-CM | POA: Diagnosis not present

## 2024-05-28 LAB — CBC
HCT: 40.4 % (ref 36.0–46.0)
Hemoglobin: 13.1 g/dL (ref 12.0–15.0)
MCH: 31.5 pg (ref 26.0–34.0)
MCHC: 32.4 g/dL (ref 30.0–36.0)
MCV: 97.1 fL (ref 80.0–100.0)
Platelets: 282 K/uL (ref 150–400)
RBC: 4.16 MIL/uL (ref 3.87–5.11)
RDW: 13.1 % (ref 11.5–15.5)
WBC: 6.6 K/uL (ref 4.0–10.5)
nRBC: 0 % (ref 0.0–0.2)

## 2024-05-28 LAB — BASIC METABOLIC PANEL WITH GFR
Anion gap: 8 (ref 5–15)
BUN: 16 mg/dL (ref 8–23)
CO2: 28 mmol/L (ref 22–32)
Calcium: 9.6 mg/dL (ref 8.9–10.3)
Chloride: 103 mmol/L (ref 98–111)
Creatinine, Ser: 0.76 mg/dL (ref 0.44–1.00)
GFR, Estimated: >60 mL/min (ref >60)
Glucose, Bld: 94 mg/dL (ref 70–99)
Potassium: 4.4 mmol/L (ref 3.5–5.1)
Sodium: 140 mmol/L (ref 135–145)

## 2024-05-28 NOTE — Progress Notes (Signed)
 For Anesthesia: PCP - Antonio Cyndee Jamee JONELLE, DO  Cardiologist - N/A  Bowel Prep reminder:N/A  Chest x-ray -  EKG -  Stress Test -  ECHO -  Cardiac Cath -  Pacemaker/ICD device last checked: Pacemaker orders received: Device Rep notified:  Spinal Cord Stimulator:N/A  Sleep Study - N/A CPAP -   Fasting Blood Sugar - N/A Checks Blood Sugar _____ times a day Date and result of last Hgb A1c-  Last dose of GLP1 agonist- N/A GLP1 instructions: Hold 7 days prior to schedule (Hold 24 hours-daily)   Last dose of SGLT-2 inhibitors- N/A SGLT-2 instructions: Hold 72 hours prior to surgery  Blood Thinner Instructions:N/A Last Dose: Time last taken:  Aspirin Instructions:N/A Last Dose: Time last taken:  Activity level: Can go up a flight of stairs and activities of daily living without stopping and without chest pain and/or shortness of breath   Able to exercise without chest pain and/or shortness of breath  Anesthesia review:   Patient denies shortness of breath, fever, cough and chest pain at PAT appointment   Patient verbalized understanding of instructions that were reviewed over the telephone.

## 2024-05-29 ENCOUNTER — Encounter: Payer: Self-pay | Admitting: Family Medicine

## 2024-05-31 ENCOUNTER — Encounter (INDEPENDENT_AMBULATORY_CARE_PROVIDER_SITE_OTHER): Payer: Self-pay

## 2024-06-04 LAB — GENECONNECT MOLECULAR SCREEN: Genetic Analysis Overall Interpretation: NEGATIVE

## 2024-06-05 ENCOUNTER — Ambulatory Visit (HOSPITAL_COMMUNITY)
Admission: RE | Admit: 2024-06-05 | Discharge: 2024-06-05 | Disposition: A | Discharge status: Home / Self Care | Source: Ambulatory Visit | Attending: Urology | Admitting: Urology

## 2024-06-05 ENCOUNTER — Ambulatory Visit (HOSPITAL_COMMUNITY)

## 2024-06-05 ENCOUNTER — Ambulatory Visit (HOSPITAL_COMMUNITY): Payer: Self-pay | Admitting: Certified Registered"

## 2024-06-05 ENCOUNTER — Ambulatory Visit (HOSPITAL_COMMUNITY): Payer: Self-pay | Admitting: Physician Assistant

## 2024-06-05 ENCOUNTER — Encounter (HOSPITAL_COMMUNITY)
Admission: RE | Disposition: A | Discharge status: Home / Self Care | Payer: Self-pay | Source: Ambulatory Visit | Attending: Urology

## 2024-06-05 ENCOUNTER — Other Ambulatory Visit: Payer: Self-pay

## 2024-06-05 ENCOUNTER — Encounter (HOSPITAL_COMMUNITY): Payer: Self-pay | Admitting: Urology

## 2024-06-05 DIAGNOSIS — N135 Crossing vessel and stricture of ureter without hydronephrosis: Secondary | ICD-10-CM | POA: Diagnosis not present

## 2024-06-05 DIAGNOSIS — N1330 Unspecified hydronephrosis: Secondary | ICD-10-CM | POA: Diagnosis not present

## 2024-06-05 DIAGNOSIS — N131 Hydronephrosis with ureteral stricture, not elsewhere classified: Secondary | ICD-10-CM | POA: Insufficient documentation

## 2024-06-05 HISTORY — PX: CYSTOSCOPY/URETEROSCOPY/HOLMIUM LASER/STENT PLACEMENT: SHX6546

## 2024-06-05 HISTORY — PX: CYSTOSCOPY W/ RETROGRADES: SHX1426

## 2024-06-05 SURGERY — CYSTOSCOPY/URETEROSCOPY/HOLMIUM LASER/STENT PLACEMENT
Anesthesia: General | Laterality: Right

## 2024-06-05 MED ORDER — SODIUM CHLORIDE 0.9 % IV SOLN: INTRAVENOUS | Status: DC | PRN

## 2024-06-05 MED ORDER — MEPERIDINE HCL 25 MG/ML IJ SOLN: 6.2500 mg | INTRAMUSCULAR | Status: DC | PRN

## 2024-06-05 MED ORDER — ORAL CARE MOUTH RINSE
15.0000 mL | Freq: Once | OROMUCOSAL | Status: AC
  Administered 2024-06-05: 15 mL via OROMUCOSAL

## 2024-06-05 MED ORDER — ACETAMINOPHEN 500 MG PO TABS
1000.0000 mg | ORAL_TABLET | Freq: Once | ORAL | Status: AC
  Administered 2024-06-05: 1000 mg via ORAL
  Filled 2024-06-05: qty 2

## 2024-06-05 MED ORDER — FENTANYL CITRATE (PF) 50 MCG/ML IJ SOSY: 25.0000 ug | PREFILLED_SYRINGE | INTRAMUSCULAR | Status: DC | PRN

## 2024-06-05 MED ORDER — ONDANSETRON HCL 4 MG/2ML IJ SOLN
INTRAMUSCULAR | Status: AC
  Filled 2024-06-05: qty 2

## 2024-06-05 MED ORDER — SODIUM CHLORIDE 0.9 % IV SOLN: INTRAVENOUS | Status: DC

## 2024-06-05 MED ORDER — MIDAZOLAM HCL (PF) 2 MG/2ML IJ SOLN: 0.5000 mg | Freq: Once | INTRAMUSCULAR | Status: DC | PRN

## 2024-06-05 MED ORDER — LACTATED RINGERS IV SOLN: INTRAVENOUS | Status: DC

## 2024-06-05 MED ORDER — SODIUM CHLORIDE 0.9 % IR SOLN
Status: DC | PRN
  Administered 2024-06-05: 3000 mL via INTRAVESICAL

## 2024-06-05 MED ORDER — PROPOFOL 10 MG/ML IV BOLUS
INTRAVENOUS | Status: DC | PRN
  Administered 2024-06-05: 180 mg via INTRAVENOUS
  Administered 2024-06-05: 20 mg via INTRAVENOUS

## 2024-06-05 MED ORDER — MIDAZOLAM HCL 2 MG/2ML IJ SOLN
INTRAMUSCULAR | Status: AC
  Filled 2024-06-05: qty 2

## 2024-06-05 MED ORDER — IOHEXOL 300 MG/ML  SOLN
INTRAMUSCULAR | Status: DC | PRN
  Administered 2024-06-05: 30 mL

## 2024-06-05 MED ORDER — LIDOCAINE HCL (CARDIAC) PF 100 MG/5ML IV SOSY
PREFILLED_SYRINGE | INTRAVENOUS | Status: DC | PRN
  Administered 2024-06-05: 40 mg via INTRAVENOUS

## 2024-06-05 MED ORDER — PROPOFOL 10 MG/ML IV BOLUS
INTRAVENOUS | Status: AC
  Filled 2024-06-05: qty 20

## 2024-06-05 MED ORDER — FENTANYL CITRATE (PF) 100 MCG/2ML IJ SOLN
INTRAMUSCULAR | Status: AC
  Filled 2024-06-05: qty 2

## 2024-06-05 MED ORDER — SODIUM CHLORIDE 0.9 % IV SOLN
2.0000 g | INTRAVENOUS | Status: AC
  Administered 2024-06-05: 2 g via INTRAVENOUS
  Filled 2024-06-05: qty 20

## 2024-06-05 MED ORDER — OXYCODONE HCL 5 MG PO TABS: 5.0000 mg | ORAL_TABLET | Freq: Once | ORAL | Status: DC | PRN

## 2024-06-05 MED ORDER — MIDAZOLAM HCL (PF) 2 MG/2ML IJ SOLN
INTRAMUSCULAR | Status: DC | PRN
  Administered 2024-06-05: 2 mg via INTRAVENOUS

## 2024-06-05 MED ORDER — CHLORHEXIDINE GLUCONATE 0.12 % MT SOLN: 15.0000 mL | Freq: Once | OROMUCOSAL | Status: AC

## 2024-06-05 MED ORDER — FENTANYL CITRATE (PF) 100 MCG/2ML IJ SOLN
INTRAMUSCULAR | Status: DC | PRN
  Administered 2024-06-05 (×2): 25 ug via INTRAVENOUS
  Administered 2024-06-05: 50 ug via INTRAVENOUS

## 2024-06-05 MED ORDER — OXYCODONE HCL 5 MG/5ML PO SOLN: 5.0000 mg | Freq: Once | ORAL | Status: DC | PRN

## 2024-06-05 MED ORDER — SCOPOLAMINE 1 MG/3DAYS TD PT72: 1.0000 | MEDICATED_PATCH | TRANSDERMAL | Status: DC

## 2024-06-05 MED ORDER — HYDROCODONE-ACETAMINOPHEN 5-325 MG PO TABS: 1.0000 | ORAL_TABLET | Freq: Four times a day (QID) | ORAL | 0 refills | Status: DC | PRN

## 2024-06-05 MED ORDER — DEXAMETHASONE SOD PHOSPHATE PF 10 MG/ML IJ SOLN
INTRAMUSCULAR | Status: DC | PRN
  Administered 2024-06-05: 4 mg via INTRAVENOUS

## 2024-06-05 SURGICAL SUPPLY — 20 items
BAG URO CATCHER STRL LF (MISCELLANEOUS) ×1 IMPLANT
BASKET LASER NITINOL 1.9FR (BASKET) IMPLANT
BASKET ZERO TIP NITINOL 2.4FR (BASKET) IMPLANT
CATH URETERAL DUAL LUMEN 10F (MISCELLANEOUS) IMPLANT
CATH URETL OPEN END 6FR 70 (CATHETERS) ×1 IMPLANT
CLOTH BEACON ORANGE TIMEOUT ST (SAFETY) ×1 IMPLANT
GLOVE BIO SURGEON STRL SZ7.5 (GLOVE) ×1 IMPLANT
GOWN STRL REUS W/ TWL XL LVL3 (GOWN DISPOSABLE) ×1 IMPLANT
GUIDEWIRE ANG ZIPWIRE 038X150 (WIRE) IMPLANT
GUIDEWIRE STR DUAL SENSOR (WIRE) ×1 IMPLANT
KIT BALLIN UROMAX 15FX10 (LABEL) IMPLANT
KIT TURNOVER KIT A (KITS) ×1 IMPLANT
MANIFOLD NEPTUNE II (INSTRUMENTS) ×1 IMPLANT
PACK CYSTO (CUSTOM PROCEDURE TRAY) ×1 IMPLANT
SHEATH NAVIGATOR HD 11/13X28 (SHEATH) IMPLANT
SHEATH NAVIGATOR HD 11/13X36 (SHEATH) IMPLANT
STENT URET 6FRX24 CONTOUR (STENTS) IMPLANT
TRACTIP FLEXIVA PULS ID 200XHI (Laser) IMPLANT
TUBING CONNECTING 10 (TUBING) ×1 IMPLANT
TUBING UROLOGY SET (TUBING) ×1 IMPLANT

## 2024-06-05 NOTE — Anesthesia Procedure Notes (Signed)
 Procedure Name: LMA Insertion Date/Time: 06/05/2024 11:54 AM  Performed by: Metta Andrea NOVAK, CRNAPre-anesthesia Checklist: Patient identified, Emergency Drugs available, Suction available, Patient being monitored and Timeout performed Patient Re-evaluated:Patient Re-evaluated prior to induction Oxygen Delivery Method: Circle system utilized Preoxygenation: Pre-oxygenation with 100% oxygen Induction Type: IV induction Ventilation: Mask ventilation without difficulty LMA: LMA inserted LMA Size: 4.0 Number of attempts: 1 Tube secured with: Tape Dental Injury: Teeth and Oropharynx as per pre-operative assessment

## 2024-06-05 NOTE — Anesthesia Preprocedure Evaluation (Addendum)
 Anesthesia Evaluation  Patient identified by MRN, date of birth, ID band Patient awake    Reviewed: Allergy & Precautions, NPO status , Patient's Chart, lab work & pertinent test results  History of Anesthesia Complications Negative for: history of anesthetic complications  Airway Mallampati: I  TM Distance: >3 FB Neck ROM: Full    Dental  (+) Caps, Dental Advisory Given   Pulmonary neg pulmonary ROS   breath sounds clear to auscultation       Cardiovascular negative cardio ROS  Rhythm:Regular Rate:Normal     Neuro/Psych negative neurological ROS     GI/Hepatic negative GI ROS, Neg liver ROS,,,  Endo/Other    Renal/GU hydronephrosis     Musculoskeletal   Abdominal   Peds  Hematology Hb 13.1, plt 282k   Anesthesia Other Findings   Reproductive/Obstetrics                              Anesthesia Physical Anesthesia Plan  ASA: 2  Anesthesia Plan: General   Post-op Pain Management: Tylenol PO (pre-op)*   Induction: Intravenous  PONV Risk Score and Plan: 3 and Ondansetron, Dexamethasone, Treatment may vary due to age or medical condition and Scopolamine patch - Pre-op  Airway Management Planned: LMA  Additional Equipment: None  Intra-op Plan:   Post-operative Plan: Extubation in OR  Informed Consent: I have reviewed the patients History and Physical, chart, labs and discussed the procedure including the risks, benefits and alternatives for the proposed anesthesia with the patient or authorized representative who has indicated his/her understanding and acceptance.     Dental advisory given  Plan Discussed with: CRNA and Surgeon  Anesthesia Plan Comments:          Anesthesia Quick Evaluation

## 2024-06-05 NOTE — Anesthesia Postprocedure Evaluation (Signed)
 Anesthesia Post Note  Patient: AFIYA FERREBEE  Procedure(s) Performed: CYSTOSCOPY/URETEROSCOPY/BALLOON DILATION/STENT PLACEMENT (Right) CYSTOSCOPY, WITH RETROGRADE PYELOGRAM (Right)     Patient location during evaluation: PACU Anesthesia Type: General Level of consciousness: awake and alert, oriented and patient cooperative Pain management: pain level controlled Vital Signs Assessment: post-procedure vital signs reviewed and stable Respiratory status: spontaneous breathing, nonlabored ventilation and respiratory function stable Cardiovascular status: blood pressure returned to baseline and stable Postop Assessment: no apparent nausea or vomiting Anesthetic complications: no   No notable events documented.  Last Vitals:  Vitals:   06/05/24 1245 06/05/24 1300  BP: (!) 180/97 (!) 162/90  Pulse: 68 64  Resp: 17 17  Temp: (!) 36.3 C   SpO2: 100% 100%    Last Pain:  Vitals:   06/05/24 1300  TempSrc:   PainSc: Asleep                 Vinicius Brockman,E. Hadlie Gipson

## 2024-06-05 NOTE — H&P (Signed)
 H&P  Chief Complaint: Right hydronephrosis  History of Present Illness: 68 year old female with right-sided hydronephrosis who went to the emergency department with severe right-sided abdominal pain.  On CT scan she likely has ureteral kinking but presents for diagnostic ureteroscopy to rule out any other anatomical abnormality, stone, or tumor causing the hydronephrosis.  She has had no pain or discomfort since seeing me in the clinic.  Past Medical History:  Diagnosis Date   Constipation    Rectal bleeding    Rectal pain    Past Surgical History:  Procedure Laterality Date   COLONOSCOPY  07/18/2004   hemorrhoids;[facility]   DILATION AND CURETTAGE OF UTERUS  07/18/1993   G 3 P 2     TUBAL LIGATION     Dr Charlie Aho    Home Medications:  Medications Prior to Admission  Medication Sig Dispense Refill Last Dose/Taking   fluticasone  (FLONASE ) 50 MCG/ACT nasal spray Place 2 sprays into both nostrils daily. (Patient taking differently: Place 2 sprays into both nostrils daily as needed for allergies.) 16 g 5 Past Month   levocetirizine (XYZAL ) 5 MG tablet Take 1 tablet (5 mg total) by mouth every evening.   Past Week   Multiple Vitamin (MULTIVITAMIN) tablet Take 1 tablet by mouth daily.   05/29/2024   Omega-3 Fatty Acids (FISH OIL) 1000 MG CAPS Take 1,000 mg by mouth daily.   05/29/2024   Allergies:  Allergies  Allergen Reactions   Chlorhexidine  Gluconate Itching and Rash    Family History  Problem Relation Age of Onset   Breast cancer Mother 102   Heart attack Father 67   Heart disease Father        MI at age 61   Hyperlipidemia Father    Factor V Leiden deficiency Sister    Heart attack Paternal Grandfather        early 68s (NOT definite)   Factor V Leiden deficiency Nephew    Factor V Leiden deficiency Niece    Factor V Leiden deficiency Niece    Diabetes Neg Hx    Hypertension Neg Hx    Stroke Neg Hx    Colon cancer Neg Hx    Colon polyps Neg Hx    Social  History:  reports that she has never smoked. She has never been exposed to tobacco smoke. She has never used smokeless tobacco. She reports current alcohol use of about 3.0 standard drinks of alcohol per week. She reports that she does not use drugs.  ROS: A complete review of systems was performed.  All systems are negative except for pertinent findings as noted. ROS   Physical Exam:  Vital signs in last 24 hours: Temp:  [97.7 F (36.5 C)] 97.7 F (36.5 C) (11/19 0909) Pulse Rate:  [62] 62 (11/19 0909) Resp:  [16] 16 (11/19 0909) BP: (128)/(87) 128/87 (11/19 0909) SpO2:  [98 %] 98 % (11/19 0909) Weight:  [61.7 kg] 61.7 kg (11/19 0930) General:  Alert and oriented, No acute distress HEENT: Normocephalic, atraumatic Neck: No JVD or lymphadenopathy Cardiovascular: Regular rate and rhythm Lungs: Regular rate and effort Abdomen: Soft, nontender, nondistended, no abdominal masses Back: No CVA tenderness Extremities: No edema Neurologic: Grossly intact  Laboratory Data:  No results found for this or any previous visit (from the past 24 hours). No results found for this or any previous visit (from the past 240 hours). Creatinine: No results for input(s): CREATININE in the last 168 hours.  Impression/Assessment:  Right hydronephrosis  Plan:  Proceed with cystoscopy with right diagnostic ureteroscopy with possible intervention such as biopsy, laser ablation of tumor or stricture, balloon dilation, or ureteral stent placement.  Risk benefits discussed.  Sherwood JONETTA Edison, III 06/05/2024, 11:18 AM

## 2024-06-05 NOTE — Discharge Instructions (Signed)

## 2024-06-05 NOTE — Op Note (Signed)
 Operative Note  Preoperative diagnosis:  1.  Right hydronephrosis  Postoperative diagnosis: 1.  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  Cystoscopy with right retrograde pyelogram, right diagnostic ureteroscopy, right ureteral balloon dilation, right ureteral stent placement  Surgeon: Sherwood Edison, MD  Assistants: None  Anesthesia: General  Complications: None immediate  EBL: Minimal  Specimens: 1.  None  Drains/Catheters: 1.  6 x 24 double-J ureteral stent  Intraoperative findings: 1.  Normal urethra bladder   2.  Right retrograde pyelogram revealed hydroureteronephrosis down to the mid to distal ureter where there was a clear transition point.  She also had significant tortuosity of the proximal ureter/ureteropelvic junction but there was no tumor, stone, or stricture proximally  3.  Diagnostic ureteroscopy confirmed presence of a mild circumferential ureteral stricture that was dilated to 15 French with balloon dilator for 5 minutes.  Indication: 68 year old female was found to have hydronephrosis on CT scan in the emergency department.  She presents for diagnostic ureteroscopy.  Description of procedure:  The patient was identified and consent was obtained.  The patient was taken to the operating room and placed in the supine position.  The patient was placed under general anesthesia.  Perioperative antibiotics were administered.  The patient was placed in dorsal lithotomy.  Patient was prepped and draped in a standard sterile fashion and a timeout was performed.  A 21 French rigid cystoscope was advanced into the urethra and into the bladder.  Complete cystoscopy was performed with no abnormal findings.  Right ureteral orifice was intubated with an open-ended ureteral catheter and a retrograde pyelogram was performed with findings noted above.  Advance the wire up the right ureter and into the kidney.  There was some mild resistance at  the ureteropelvic junction where there was noted to be some tortuosity of the ureter but I was able to navigate the wire into the kidney.  Semirigid ureteroscopy was performed alongside the wire and this confirmed presence of a mild circumferential ureteral stricture mid to distal ureter.  I withdrew the scope and advanced the ureteral balloon dilator over the wire under fluoroscopic guidance and I transversed the stricture and inflated the balloon to a pressure of 18.  This remained inflated for approximately 5 minutes.  I deflated the balloon and removed it.  Semirigid ureteroscopy was again performed which revealed a wide open ureter at the area of stricturing and I navigated the scope up to the renal pelvis.  There was some tortuosity of the ureter but I was able to follow the wire up to the kidney.  There were no tumors, stones, or strictures proximally.  I withdrew the scope visualizing the ureter upon removal.  I backloaded the wire onto rigid cystoscope and passed that into the bladder followed by routine placement of a 6 x 24 double-J ureteral stent.  Fluoroscopy confirmed proper placement and direct visualization confirmed a good coil within the bladder.  I drained the bladder and withdrew the scope.  Patient tolerated procedure well.  Postoperatively.  Plan: Follow-up in 2 weeks for stent removal.  She will need renal ultrasound 1 month later and if hydronephrosis is present she will need a Lasix renogram.

## 2024-06-05 NOTE — Transfer of Care (Signed)
 Immediate Anesthesia Transfer of Care Note  Patient: Karen Gilmore  Procedure(s) Performed: CYSTOSCOPY/URETEROSCOPY/BALLOON DILATION/STENT PLACEMENT (Right) CYSTOSCOPY, WITH RETROGRADE PYELOGRAM (Right)  Patient Location: PACU  Anesthesia Type:General  Level of Consciousness: drowsy and patient cooperative  Airway & Oxygen Therapy: Patient Spontanous Breathing and Patient connected to face mask oxygen  Post-op Assessment: Report given to RN and Post -op Vital signs reviewed and stable  Post vital signs: Reviewed and stable  Last Vitals:  Vitals Value Taken Time  BP 180/97   Temp    Pulse 68 06/05/24 12:45  Resp 17 06/05/24 12:45  SpO2 100 % 06/05/24 12:45  Vitals shown include unfiled device data.  Last Pain:  Vitals:   06/05/24 0950  TempSrc:   PainSc: 0-No pain      Patients Stated Pain Goal: 4 (06/05/24 0930)  Complications: No notable events documented.

## 2024-06-06 ENCOUNTER — Encounter (HOSPITAL_COMMUNITY): Payer: Self-pay | Admitting: Urology

## 2024-06-18 DIAGNOSIS — H5213 Myopia, bilateral: Secondary | ICD-10-CM | POA: Diagnosis not present

## 2024-06-19 DIAGNOSIS — Z466 Encounter for fitting and adjustment of urinary device: Secondary | ICD-10-CM | POA: Diagnosis not present

## 2024-06-19 DIAGNOSIS — N131 Hydronephrosis with ureteral stricture, not elsewhere classified: Secondary | ICD-10-CM | POA: Diagnosis not present

## 2024-07-05 ENCOUNTER — Emergency Department (HOSPITAL_COMMUNITY)

## 2024-07-05 ENCOUNTER — Other Ambulatory Visit: Payer: Self-pay

## 2024-07-05 ENCOUNTER — Other Ambulatory Visit: Payer: Self-pay | Admitting: Gynecologic Oncology

## 2024-07-05 ENCOUNTER — Encounter (HOSPITAL_COMMUNITY): Payer: Self-pay

## 2024-07-05 ENCOUNTER — Emergency Department (HOSPITAL_COMMUNITY)
Admission: EM | Admit: 2024-07-05 | Discharge: 2024-07-05 | Disposition: A | Discharge status: Home / Self Care | Source: Ambulatory Visit | Attending: Emergency Medicine | Admitting: Emergency Medicine

## 2024-07-05 DIAGNOSIS — C768 Malignant neoplasm of other specified ill-defined sites: Secondary | ICD-10-CM | POA: Diagnosis not present

## 2024-07-05 DIAGNOSIS — R140 Abdominal distension (gaseous): Secondary | ICD-10-CM | POA: Diagnosis present

## 2024-07-05 DIAGNOSIS — N1330 Unspecified hydronephrosis: Secondary | ICD-10-CM | POA: Insufficient documentation

## 2024-07-05 DIAGNOSIS — R188 Other ascites: Secondary | ICD-10-CM | POA: Diagnosis not present

## 2024-07-05 DIAGNOSIS — C7982 Secondary malignant neoplasm of genital organs: Secondary | ICD-10-CM | POA: Insufficient documentation

## 2024-07-05 DIAGNOSIS — R109 Unspecified abdominal pain: Secondary | ICD-10-CM | POA: Insufficient documentation

## 2024-07-05 DIAGNOSIS — R180 Malignant ascites: Secondary | ICD-10-CM

## 2024-07-05 DIAGNOSIS — N838 Other noninflammatory disorders of ovary, fallopian tube and broad ligament: Secondary | ICD-10-CM | POA: Diagnosis not present

## 2024-07-05 DIAGNOSIS — K429 Umbilical hernia without obstruction or gangrene: Secondary | ICD-10-CM | POA: Diagnosis not present

## 2024-07-05 DIAGNOSIS — C561 Malignant neoplasm of right ovary: Secondary | ICD-10-CM

## 2024-07-05 HISTORY — PX: IR PARACENTESIS: IMG2679

## 2024-07-05 LAB — BODY FLUID CELL COUNT WITH DIFFERENTIAL
Lymphs, Fluid: 69 %
Monocyte-Macrophage-Serous Fluid: 12 % — ABNORMAL LOW (ref 50–90)
Neutrophil Count, Fluid: 19 % (ref 0–25)
Total Nucleated Cell Count, Fluid: 2533 uL — ABNORMAL HIGH (ref 0–1000)

## 2024-07-05 LAB — URINALYSIS, ROUTINE W REFLEX MICROSCOPIC
Bacteria, UA: NONE SEEN
Bilirubin Urine: NEGATIVE
Glucose, UA: NEGATIVE mg/dL
Hgb urine dipstick: NEGATIVE
Ketones, ur: 5 mg/dL — AB
Leukocytes,Ua: NEGATIVE
Nitrite: NEGATIVE
Protein, ur: 30 mg/dL — AB
Specific Gravity, Urine: 1.018 (ref 1.005–1.030)
pH: 5 (ref 5.0–8.0)

## 2024-07-05 LAB — COMPREHENSIVE METABOLIC PANEL WITH GFR
ALT: 67 U/L — ABNORMAL HIGH (ref 0–44)
AST: 99 U/L — ABNORMAL HIGH (ref 15–41)
Albumin: 3.8 g/dL (ref 3.5–5.0)
Alkaline Phosphatase: 105 U/L (ref 38–126)
Anion gap: 10 (ref 5–15)
BUN: 13 mg/dL (ref 8–23)
CO2: 26 mmol/L (ref 22–32)
Calcium: 9.3 mg/dL (ref 8.9–10.3)
Chloride: 102 mmol/L (ref 98–111)
Creatinine, Ser: 0.72 mg/dL (ref 0.44–1.00)
GFR, Estimated: >60 mL/min
Glucose, Bld: 98 mg/dL (ref 70–99)
Potassium: 4.7 mmol/L (ref 3.5–5.1)
Sodium: 138 mmol/L (ref 135–145)
Total Bilirubin: 0.3 mg/dL (ref 0.0–1.2)
Total Protein: 7 g/dL (ref 6.5–8.1)

## 2024-07-05 LAB — CBC
HCT: 41.6 % (ref 36.0–46.0)
Hemoglobin: 13.4 g/dL (ref 12.0–15.0)
MCH: 30.5 pg (ref 26.0–34.0)
MCHC: 32.2 g/dL (ref 30.0–36.0)
MCV: 94.5 fL (ref 80.0–100.0)
Platelets: 410 K/uL — ABNORMAL HIGH (ref 150–400)
RBC: 4.4 MIL/uL (ref 3.87–5.11)
RDW: 13.5 % (ref 11.5–15.5)
WBC: 8.2 K/uL (ref 4.0–10.5)
nRBC: 0 % (ref 0.0–0.2)

## 2024-07-05 LAB — LIPASE, BLOOD: Lipase: 22 U/L (ref 11–51)

## 2024-07-05 MED ORDER — LIDOCAINE-EPINEPHRINE 1 %-1:100000 IJ SOLN
20.0000 mL | Freq: Once | INTRAMUSCULAR | Status: AC
  Administered 2024-07-05: 10 mL via INTRADERMAL
  Filled 2024-07-05: qty 20

## 2024-07-05 MED ORDER — LIDOCAINE-EPINEPHRINE 1 %-1:100000 IJ SOLN
INTRAMUSCULAR | Status: AC
  Filled 2024-07-05: qty 20

## 2024-07-05 MED ORDER — IOHEXOL 300 MG/ML  SOLN
80.0000 mL | Freq: Once | INTRAMUSCULAR | Status: AC | PRN
  Administered 2024-07-05: 80 mL via INTRAVENOUS

## 2024-07-05 MED ORDER — OXYCODONE-ACETAMINOPHEN 5-325 MG PO TABS: 1.0000 | ORAL_TABLET | Freq: Four times a day (QID) | ORAL | 0 refills | Status: AC | PRN

## 2024-07-05 MED ORDER — ONDANSETRON HCL 4 MG PO TABS: 4.0000 mg | ORAL_TABLET | Freq: Four times a day (QID) | ORAL | 0 refills | Status: AC

## 2024-07-05 MED ORDER — MORPHINE SULFATE (PF) 4 MG/ML IV SOLN
4.0000 mg | Freq: Once | INTRAVENOUS | Status: AC
  Administered 2024-07-05: 4 mg via INTRAVENOUS
  Filled 2024-07-05: qty 1

## 2024-07-05 NOTE — ED Notes (Signed)
Urine cup given for specimen

## 2024-07-05 NOTE — ED Provider Notes (Signed)
 Patient signed out to me at 1500 by Dr. Dasie pending paracentesis and urology consultation.  In short this is a 68 year old female with past medical history of right-sided hydronephrosis with recent stent removal that presented to the emergency department from the urology office with abdominal pain and distention.  She was found to have ascites and recommended ED evaluation.  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  Patient's labs are otherwise within normal range.  She underwent diagnostic and therapeutic paracentesis with IR and had 4 L of fluid drained.  Status post IR procedure and the patient reports that her pain and swelling has significantly improved.  Her abdomen is soft and nontender at this time.  Spoke with Dr. Selma with urology regarding right sided hydroureter and hydronephrosis secondary to the adnexal mass and who states that if she is not having significant pain and with normal renal function she can follow-up as an outpatient for monitoring for need of any recurrent stent placements in the future.  The patient has been given oncology referral.  She is stable for discharge home with close outpatient follow-up.   Kingsley, Darron Stuck K, DO 07/05/24 [phone]

## 2024-07-05 NOTE — ED Provider Notes (Incomplete)
 " [place] EMERGENCY DEPARTMENT AT [facility] Provider Note   CSN: [id number] Arrival date & time: 07/05/24  1058     Patient presents with: Abdominal Pain   Karen Gilmore is a 68 y.o. female.  {Add pertinent medical, surgical, social history, OB history to HPI:4263} 68 year old female presents with abdominal distention for about a week.  Patient has a history of stent to her right kidney due to a kinking of her ureter.  Had it removed sometime ago.  No recent fever or chills.  Saw her urologist today who did ultrasound was concerned about free fluid in her abdomen.  She denies any fever.  No current urinary symptoms.  Able to make urine appropriately.  Patient sent to for further evaluation       Prior to Admission medications  Medication Sig Start Date End Date Taking? Authorizing Provider  fluticasone  (FLONASE ) 50 MCG/ACT nasal spray Place 2 sprays into both nostrils daily. Patient taking differently: Place 2 sprays into both nostrils daily as needed for allergies. 07/27/23   Lowne Chase, Yvonne R, DO  HYDROcodone -acetaminophen  (NORCO/VICODIN) 5-325 MG tablet Take 1 tablet by mouth every 6 (six) hours as needed. 06/05/24   Carolee Sherwood JONETTA DOUGLAS, MD  levocetirizine (XYZAL ) 5 MG tablet Take 1 tablet (5 mg total) by mouth every evening. 03/04/24   Lowne Chase, Yvonne R, DO  Multiple Vitamin (MULTIVITAMIN) tablet Take 1 tablet by mouth daily.    [provider]  Omega-3 Fatty Acids (FISH OIL) 1000 MG CAPS Take 1,000 mg by mouth daily.    [provider]    Allergies: Chlorhexidine  gluconate    Review of Systems  All other systems reviewed and are negative.   Updated Vital Signs BP (!) 155/94 (BP Location: Left Arm)   Pulse 81   Temp 97.7 F (36.5 C) (Oral)   Resp 16   Ht 1.626 m (5' 4)   Wt 61.2 kg   SpO2 100%   BMI 23.17 kg/m   Physical Exam Vitals and nursing note reviewed.  Constitutional:      General: She is not in acute distress.     Appearance: Normal appearance. She is well-developed. She is not toxic-appearing.  HENT:     Head: Normocephalic and atraumatic.  Eyes:     General: Lids are normal.     Conjunctiva/sclera: Conjunctivae normal.     Pupils: Pupils are equal, round, and reactive to light.  Neck:     Thyroid : No thyroid  mass.     Trachea: No tracheal deviation.  Cardiovascular:     Rate and Rhythm: Normal rate and regular rhythm.     Heart sounds: Normal heart sounds. No murmur heard.    No gallop.  Pulmonary:     Effort: Pulmonary effort is normal. No respiratory distress.     Breath sounds: Normal breath sounds. No stridor. No decreased breath sounds, wheezing, rhonchi or rales.  Abdominal:     General: There is distension.     Palpations: Abdomen is soft.     Tenderness: There is no abdominal tenderness. There is no rebound.  Musculoskeletal:        General: No tenderness. Normal range of motion.     Cervical back: Normal range of motion and neck supple.  Skin:    General: Skin is warm and dry.     Findings: No abrasion or rash.  Neurological:     Mental Status: She is alert and oriented to person, place, and  time. Mental status is at baseline.     GCS: GCS eye subscore is 4. GCS verbal subscore is 5. GCS motor subscore is 6.     Cranial Nerves: No cranial nerve deficit.     Sensory: No sensory deficit.     Motor: Motor function is intact.  Psychiatric:        Attention and Perception: Attention normal.        Speech: Speech normal.        Behavior: Behavior normal.     (all labs ordered are listed, but only abnormal results are displayed) Labs Reviewed  COMPREHENSIVE METABOLIC PANEL WITH GFR - Abnormal; Notable for the following components:      Result Value   AST 99 (*)    ALT 67 (*)    All other components within normal limits  CBC - Abnormal; Notable for the following components:   Platelets 410 (*)    All other components within normal limits  URINALYSIS, ROUTINE W REFLEX  MICROSCOPIC - Abnormal; Notable for the following components:   Ketones, ur 5 (*)    Protein, ur 30 (*)    All other components within normal limits  LIPASE, BLOOD    EKG: None  Radiology: No results found.  {Document cardiac monitor, telemetry assessment procedure when appropriate:32947} Procedures   Medications Ordered in the ED - No data to display    {Click here for ABCD2, HEART and other calculators REFRESH Note before signing:1}                              Medical Decision Making Amount and/or Complexity of Data Reviewed Labs: ordered. Radiology: ordered.   ***  {Document critical care time when appropriate  Document review of labs and clinical decision tools ie CHADS2VASC2, etc  Document your independent review of radiology images and any outside records  Document your discussion with family members, caretakers and with consultants  Document social determinants of health affecting pt's care  Document your decision making why or why not admission, treatments were needed:32947:::1}   Final diagnoses:  None    ED Discharge Orders     None        "

## 2024-07-05 NOTE — Procedures (Signed)
 Ultrasound-guided diagnostic and therapeutic paracentesis performed yielding 4 liters of slightly hazy, yellow fluid. No immediate complications. A portion of the fluid was sent to the lab for preordered studies. EBL none.

## 2024-07-05 NOTE — Discharge Instructions (Addendum)
 You were seen in the emergency department for your abdominal pain and swelling.  Your workup unfortunately showed a mass near your right ovary concerning for cancer as well as ascites which is fluid in your belly that could be related to the tumor.  Interventional radiology removed fluid for you in the emergency department and a procedure called a paracentesis and diagnostic studies have been sent for this fluid.  We have given you a referral for oncology and they should be calling you to schedule a follow-up appointment in the next few days.  You did have some swelling to your ureter and your kidney from compression from the mass and can follow-up with urology to have your kidney function monitored.  You should return to the emergency department if you are having uncontrollable pain despite the pain medication, repetitive vomiting despite the nausea medication, fevers or any other new or concerning symptoms.

## 2024-07-05 NOTE — ED Triage Notes (Signed)
 Patient sent over from [facility] urology. Had an ultrasound and was told she has free fluid in her abdomen. Has been experiencing pain all over her abdomen and back. This all began over a stent that was taken out. Was told her kidney is enlarged.

## 2024-07-06 ENCOUNTER — Encounter: Payer: Self-pay | Admitting: Gynecologic Oncology

## 2024-07-06 LAB — CEA: CEA: <0.6 ng/mL (ref 0.0–4.7)

## 2024-07-06 LAB — CA 125: Cancer Antigen (CA) 125: 3753 U/mL — ABNORMAL HIGH (ref 0.0–38.1)

## 2024-07-06 LAB — CANCER ANTIGEN 19-9: CA 19-9: 79 U/mL — ABNORMAL HIGH (ref 0–35)

## 2024-07-07 ENCOUNTER — Other Ambulatory Visit: Payer: Self-pay | Admitting: Hematology and Oncology

## 2024-07-07 DIAGNOSIS — C561 Malignant neoplasm of right ovary: Secondary | ICD-10-CM | POA: Insufficient documentation

## 2024-07-08 ENCOUNTER — Telehealth: Payer: Self-pay

## 2024-07-08 ENCOUNTER — Telehealth (HOSPITAL_COMMUNITY): Payer: Self-pay | Admitting: Radiology

## 2024-07-08 ENCOUNTER — Other Ambulatory Visit: Payer: Self-pay

## 2024-07-08 ENCOUNTER — Encounter: Payer: Self-pay | Admitting: Gynecologic Oncology

## 2024-07-08 ENCOUNTER — Inpatient Hospital Stay: Attending: Gynecologic Oncology | Admitting: Gynecologic Oncology

## 2024-07-08 VITALS — BP 122/85 | HR 84 | Temp 98.2°F | Resp 16 | Ht 64.0 in | Wt 132.0 lb

## 2024-07-08 DIAGNOSIS — K5900 Constipation, unspecified: Secondary | ICD-10-CM | POA: Diagnosis not present

## 2024-07-08 DIAGNOSIS — C786 Secondary malignant neoplasm of retroperitoneum and peritoneum: Secondary | ICD-10-CM | POA: Insufficient documentation

## 2024-07-08 DIAGNOSIS — R180 Malignant ascites: Secondary | ICD-10-CM

## 2024-07-08 DIAGNOSIS — N131 Hydronephrosis with ureteral stricture, not elsewhere classified: Secondary | ICD-10-CM | POA: Diagnosis not present

## 2024-07-08 DIAGNOSIS — R971 Elevated cancer antigen 125 [CA 125]: Secondary | ICD-10-CM | POA: Insufficient documentation

## 2024-07-08 DIAGNOSIS — N1330 Unspecified hydronephrosis: Secondary | ICD-10-CM | POA: Insufficient documentation

## 2024-07-08 DIAGNOSIS — R978 Other abnormal tumor markers: Secondary | ICD-10-CM | POA: Diagnosis not present

## 2024-07-08 DIAGNOSIS — C579 Malignant neoplasm of female genital organ, unspecified: Secondary | ICD-10-CM | POA: Insufficient documentation

## 2024-07-08 LAB — BODY FLUID CULTURE W GRAM STAIN
Culture: NO GROWTH
Gram Stain: NONE SEEN
Special Requests: NORMAL

## 2024-07-08 NOTE — Progress Notes (Signed)
 GYNECOLOGIC ONCOLOGY NEW PATIENT CONSULTATION   Patient Name: Karen Gilmore  Patient Age: 69 y.o. 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  I spent quite some time with the patient and her partner reviewing symptoms over the last couple of months as well as her workup in the emergency department.  We reviewed recent CT scan together and discussed imaging findings.  On imaging, she has evidence of metastatic disease with malignant ascites, extensive omental caking, peritoneal disease/carcinomatosis, what appears to be a right adnexal mass, and peripancreatic adenopathy.  We reviewed that cytology from her paracentesis on Friday is still pending and that this will help confirm the likely origin of her cancer.  Based on imaging and tumor markers, I am suspicious that findings represent metastatic gynecologic cancer.  Although highly suspicious for ovarian cancer, I stressed that until we have the cytology back, we will not formalize a treatment plan.  In the event that cytology is not diagnostic, we discussed likely getting her set up with interventional radiology for omental biopsy.  A CT scan of her chest was ordered to complete imaging staging.  If this confirms no metastatic disease outside of the abdominal cavity and this is gynecologic cancer, she likely has stage III ovarian cancer. I discussed that the treatment approach for this disease is typically combination of cytoreductive surgery and chemotherapy. I discussed that sequencing of this can be either with upfront debulking followed by adjuvant chemotherapy sequentially or neoadjuvant chemotherapy followed by an interval cytoreductive attempt, then additional chemotherapy. This latter approach is associated with a reduced perioperative  morbidity at the time of surgery.  I discussed that decisions regarding sequencing of therapy is individualized taking into account individual patient health, in addition to the apparent tumor distribution on imaging, and likelihood of complete surgical resection at the time of surgery.  I discussed my concern about the disease at the area of her pancreas as well as the ureteral stricture and the implications of what this typically means for disease along the pelvic sidewall (not just an ovarian mass).  Although the patient is an excellent candidate for surgery, I worry that imaging points to disease that would make optimal cytoreduction quite challenging if at all possible at this time. Given the observed decreased morbidity of neoadjuvant chemotherapy and imaging findings, I favor a neoadjuvant approach for her.   Order was placed last week for port placement.  This has been scheduled on 12/30.  The patient is also scheduled to meet with Dr. Lonn for consultation on 12/30.  She will have chemo education on 12/31.  She has tentatively been scheduled to begin chemotherapy on 1/5.  I reached out to Dr. Carolee while the patient was here in the office.  Her creatinine remains normal.  I spent some time discussing with Karen Gilmore the goal of maintaining her preserving kidney function, especially with chemotherapy use.  She tolerated the stent very poorly and if decompression was necessary, may require PCN placement.  I am hopeful that if we can get chemotherapy started quickly, that we will not compromise her right kidney.  We discussed typical timing with 3 cycles of chemotherapy followed by interval imaging with CT scan.  If the patient has good treatment response on imaging (which is typically accompanied by improvement in symptoms as well as decreasing tumor marker), we will likely  proceed with interval debulking surgery at that time.  Burden of disease, specifically degree of hydronephrosis and concern for  involvement of the right pelvic sidewall as well as peri-pancreatic findings will help determine whether the patient is better suited for interval debulking surgery at [facility] versus here at [facility].  In terms of her constipation, I encouraged her to continue drinking water and eating lots of fiber.  Also suggested she have MiraLAX to take as needed.  We reviewed that up to 20-25% of ovarian cancer can be caused by an inherited genetic mutation.  Referral placed for the patient to meet with one of our genetic counselors to undergo germline testing.  She will also benefit from somatic testing after surgery (or if tissue biopsy required to confirm diagnosis).  A copy of this note was sent to the patient's referring provider.   90 minutes of total time was spent for this patient encounter, including preparation, face-to-face counseling with the patient and coordination of care, and documentation of the encounter.  Comer Dollar, MD  Division of Gynecologic Oncology  Department of Obstetrics and Gynecology  [facility]  ___________________________________________  Chief Complaint: No chief complaint on file.   History of Present Illness:  Karen Gilmore is a 68 y.o. y.o. female who is seen in consultation at the request of Dr. Dasie for an evaluation of malignant ascites, metastatic cancer.  The patient comes in with her partner Karen Gilmore today.  She notes in early October starting to feel as though something more right but not having any specific symptoms.  While they were on a trip in [place] in late October, she developed extreme right lower back pain that worsened.  She ultimately went into the emergency department and was told she had a urinary tract infection.  She had a CT scan with and without contrast and was told that there was no significant abnormality but they thought she had a kidney stone and that there was a kink in her ureter.  She was given pain  medication and antibiotics.  Due to increased pain the following weekend (with some improvement using pain medication), the patient flew home early.  She saw Dr. Carolee the following Wednesday at the end of October.  She was set up for cystoscopy, which was performed on 11/19.  At the time of right retrograde pyelogram, hydroureteronephrosis was noted down to the mid to distal ureter where there was a clear transition point.  She also was noted to have significant tortuosity of the proximal ureter but no tumor, stone, or stricture.  On diagnostic ureteroscopy, ureteral stricture was noted and dilated to 15 French with balloon dilator for 5 minutes.  Stent was left in place.  Patient had the ureteral stent in for about 2 weeks and struggled significantly with pain and irritation related to the stent.  During those 2 weeks, she continued to have some general back pain.  After the stent was removed, she began feeling as though her abdomen was distended and clothing did not fit right.  While on another trip, she began feeling worse and worse and developed pressure and discomfort in her abdomen and increasing back pain.  She began having trouble taking a deep breath.  She also developed constipation, which she has worked very hard over the years to avoid given her history of hemorrhoids.  She called [facility], flew home the following day, and was seen last Friday.  On ultrasound, ascites was noted and she was  sent to the emergency department.  On CT of the abdomen and pelvis on 12/19, moderate right hydronephrosis and hydroureter up to the level of the right lower ureter is noted where an enhancing right adnexal mass is noted to be compressing over the right ureter.  There is also moderate to large ascites seen.  Irregular hyperattenuating deposits along the peritoneal reflections especially in the dependent pelvis as well as marked omental caking compatible with peritoneal carcinomatosis.  There is heterogenous  soft tissue in the peripancreatic region concerning for lymphadenopathy.  A 3 x 1 cm hyperattenuating mass is seen in the right adnexa without normal ovarian tissue.  Left ovary not visualized.  Uterus normal in appearance.  While in the emergency department, the patient underwent diagnostic and therapeutic paracentesis with 4 L of fluid removed.  Cytology is still pending.  Tumor markers were drawn and notable for normal CEA, CA125 elevated at 3753, and CA 19-9 elevated at 79.  Morrison also endorses decreased appetite with early satiety.  She denies any vaginal bleeding or discharge.  Multiple family members have factor V Leiden.  The patient has been tested and does not have this.  PAST MEDICAL HISTORY:  Past Medical History:  Diagnosis Date   Constipation    Rectal bleeding    Rectal pain      PAST SURGICAL HISTORY:  Past Surgical History:  Procedure Laterality Date   COLONOSCOPY  07/18/2004   hemorrhoids;[facility]   CYSTOSCOPY W/ RETROGRADES Right 06/05/2024   Procedure: CYSTOSCOPY, WITH RETROGRADE PYELOGRAM;  Surgeon: Carolee Sherwood JONETTA DOUGLAS, MD;  Location: [facility];  Service: Urology;  Laterality: Right;   CYSTOSCOPY/URETEROSCOPY/HOLMIUM LASER/STENT PLACEMENT Right 06/05/2024   Procedure: CYSTOSCOPY/URETEROSCOPY/BALLOON DILATION/STENT PLACEMENT;  Surgeon: Carolee Sherwood JONETTA DOUGLAS, MD;  Location: [facility];  Service: Urology;  Laterality: Right;   DILATION AND CURETTAGE OF UTERUS  07/18/1993   G 3 P 2     IR PARACENTESIS  07/05/2024   TUBAL LIGATION     Dr Charlie Aho    OB/GYN HISTORY:  OB History  Gravida Para Term Preterm AB Living  3 2    2   SAB IAB Ectopic Multiple Live Births          # Outcome Date GA Lbr Len/2nd Weight Sex Type Anes PTL Lv  3 Gravida           2 Para           1 Para             No LMP recorded. Patient is postmenopausal.  Age at menarche: 69  Age at menopause: 44 Hx of HRT: denies Hx of STDs: denies Last pap: thinks 2025 History of abnormal pap  smears: yes  SCREENING STUDIES:  Last mammogram: 2025  Last colonoscopy: 2024 Last bone mineral density: 2024  MEDICATIONS: Outpatient Encounter Medications as of 07/08/2024  Medication Sig   fluticasone  (FLONASE ) 50 MCG/ACT nasal spray Place 2 sprays into both nostrils daily.   HYDROcodone -acetaminophen  (NORCO/VICODIN) 5-325 MG tablet Take 1 tablet by mouth every 6 (six) hours as needed.   Multiple Vitamin (MULTIVITAMIN) tablet Take 1 tablet by mouth daily.   Omega-3 Fatty Acids (FISH OIL) 1000 MG CAPS Take 1,000 mg by mouth daily.   ondansetron  (ZOFRAN ) 4 MG tablet Take 1 tablet (4 mg total) by mouth every 6 (six) hours.   oxyCODONE -acetaminophen  (PERCOCET/ROXICET) 5-325 MG tablet Take 1 tablet by mouth every 6 (six) hours as needed for severe pain (pain score 7-10).   [  DISCONTINUED] levocetirizine (XYZAL ) 5 MG tablet Take 1 tablet (5 mg total) by mouth every evening.   No facility-administered encounter medications on file as of 07/08/2024.    ALLERGIES:  Allergies[1]   FAMILY HISTORY:  Family History  Problem Relation Age of Onset   Breast cancer Mother 32   Heart attack Father 64   Heart disease Father        MI at age 50   Hyperlipidemia Father    Factor V Leiden deficiency Sister    Heart attack Paternal Grandfather        early 31s (NOT definite)   Factor V Leiden deficiency Nephew    Factor V Leiden deficiency Niece    Factor V Leiden deficiency Niece    Diabetes Neg Hx    Hypertension Neg Hx    Stroke Neg Hx    Colon cancer Neg Hx    Colon polyps Neg Hx      SOCIAL HISTORY:  Social Connections: Moderately Integrated (03/04/2024)   Social Connection and Isolation Panel    Frequency of Communication with Friends and Family: More than three times a week    Frequency of Social Gatherings with Friends and Family: More than three times a week    Attends Religious Services: More than 4 times per year    Active Member of [employer] or Organizations: Yes    Attends Probation Officer: More than 4 times per year    Marital Status: Never married    REVIEW OF SYSTEMS:  + Decreased appetite, distention, constipation, frequency, incontinence, dyspareunia, back pain Denies fevers, chills, fatigue. Denies hearing loss, neck lumps or masses, mouth sores, ringing in ears or voice changes. Denies cough or wheezing.  Denies shortness of breath. Denies chest pain or palpitations. Denies leg swelling. Denies blood in stools, diarrhea, nausea, vomiting, or early satiety. Denies dysuria, hematuria. Denies hot flashes, vaginal bleeding or vaginal discharge.   Denies joint pain or muscle pain/cramps. Denies itching, rash, or wounds. Denies dizziness, headaches, numbness or seizures. Denies swollen lymph nodes or glands, denies easy bruising or bleeding. Denies anxiety, depression, confusion, or decreased concentration.  Physical Exam:  Vital Signs for this encounter:  Blood pressure 122/85, pulse 84, temperature 98.2 F (36.8 C), temperature source Oral, resp. rate 16, height 5' 4 (1.626 m), weight 132 lb (59.9 kg), SpO2 100%. Body mass index is 22.66 kg/m. General: Alert, oriented, no acute distress.  HEENT: Normocephalic, atraumatic. Sclera anicteric.  Chest: Clear to auscultation bilaterally. No wheezes, rhonchi, or rales. Cardiovascular: Regular rate and rhythm, no murmurs, rubs, or gallops.  Abdomen: Normoactive bowel sounds. Soft, mildly distended in lower abdomen, nontender to palpation. No masses or hepatosplenomegaly appreciated. + palpable fluid wave.  Extremities: Grossly normal range of motion. Warm, well perfused. No edema bilaterally.  Skin: No rashes or lesions.  Lymphatics: No cervical, supraclavicular, or inguinal adenopathy.  GU:  Normal external female genitalia. No lesions. No discharge or bleeding.             Bladder/urethra:  No lesions or masses, well supported bladder             Vagina: Mildly atrophic, no lesions.              Cervix: Normal appearing, no lesions.  Atrophic.             Uterus:  Small, mobile.  Nodularity appreciated within the cul-de-sac.             Adnexa:  Fullness within the right adnexa although no discrete mass palpated.  Rectal: Deferred.  Large hemorrhoids noted on exam.  LABORATORY AND RADIOLOGIC DATA:  Outside medical records were reviewed to synthesize the above history, along with the history and physical obtained during the visit.   Lab Results  Component Value Date   WBC 8.2 07/05/2024   HGB 13.4 07/05/2024   HCT 41.6 07/05/2024   PLT 410 (H) 07/05/2024   GLUCOSE 98 07/05/2024   CHOL 238 (H) 07/27/2023   TRIG 54.0 07/27/2023   HDL 71.10 07/27/2023   LDLDIRECT 137.7 06/12/2013   LDLCALC 156 (H) 07/27/2023   ALT 67 (H) 07/05/2024   AST 99 (H) 07/05/2024   NA 138 07/05/2024   K 4.7 07/05/2024   CL 102 07/05/2024   CREATININE 0.72 07/05/2024   BUN 13 07/05/2024   CO2 26 07/05/2024   TSH 2.01 07/27/2023       [1]  Allergies Allergen Reactions   Chlorhexidine  Gluconate Itching and Rash

## 2024-07-08 NOTE — Patient Instructions (Signed)
 It was very nice to meet you today.  My office has gotten you scheduled for a CT scan of your chest which will help us  complete the imaging to stage her cancer.  I have also asked my office to get you scheduled with a genetic counselor.  As we discussed, the majority of ovarian cancer is not caused by a genetic mutation that you inherited from one of your parents, but up to about 25% of the time it can be.  After you speak with them, you will have your blood drawn to look for genetic mutations and all of the cells that could have put you at higher risk of developing this cancer.  We will also send your cancer for genetic testing after your surgery.  Once your cytology results are back, this will help us  confirm the type of cancer that we are treating.  If the cytology is not conclusive, we will likely get you set up for a biopsy of the tumor implant in your omentum.  If the results confirm that this is gynecologic cancer, we will proceed with chemotherapy as reviewed today.  This will be with 2 different medications called carboplatin and paclitaxel that are given once every 3 weeks.  We give 3 cycles before repeating CT imaging to see how you are responding.  I spoke with Dr. Carolee who agrees that holding off on replacing your stent is best at this time.  We will follow your kidney function closely and if there seems to be worsening of your kidney function, we will have to discuss again the possibility of a stent placement.  For your bowels, please get some MiraLAX and take this as needed to keep your bowel function regular.

## 2024-07-08 NOTE — Telephone Encounter (Signed)
 Called pt to schedule port placement. Left VM. JM

## 2024-07-08 NOTE — Telephone Encounter (Signed)
 Received notification that the patient was unreachable. Returned the call and informed the patient that she will be contacted by the IR team to schedule her port placement prior to her chemotherapy appointment on 07/22/24.

## 2024-07-09 NOTE — Progress Notes (Unsigned)
 REFERRING PROVIDER: Antonio Cyndee Jamee JONELLE, DO [address] [place],  [place] [address]  PRIMARY PROVIDER:  Antonio Cyndee, Jamee JONELLE, DO  PRIMARY REASON FOR VISIT:  1. 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 Family history of breast cancer   3. Family history of pancreatic cancer   4. Family history of bladder cancer    HISTORY OF PRESENT ILLNESS:   Karen Gilmore, a 68 y.o. female, was seen for a [facility] cancer genetics consultation at the request of Jamee Antonio Cyndee, DO due to a personal history of ovarian caner.  Karen Gilmore presents to clinic today to discuss the possibility of a hereditary predisposition to cancer, genetic testing, and to further clarify her future cancer risks, as well as potential cancer risks for family members.   Diagnosis: In December 2025, at the age of 67, Karen Gilmore was diagnosed with stage III ovarian cancer. The treatment plan includes starting chemotherapy on January.   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  First live birth at age 44.  OCP use for approximately 10 years.  Oophorectomy: no.  Hysterectomy: no.  Menopausal status: postmenopausal (at 57).  HRT use: 0 years. Colonoscopy: yes; 12/2022. Mammogram within the last year: Jan 2025. Number of breast biopsies: 0. Any excessive radiation exposure or other environmental exposures the past: no Tobacco Use: Never  Past Medical History:  Diagnosis Date   Allergy 2018   Chlorhexidine  - discovered when used to clean before donating blood   Constipation    Rectal bleeding    Rectal pain    Past Surgical History:  Procedure Laterality Date   COLONOSCOPY   07/18/2004   hemorrhoids;[facility]   CYSTOSCOPY W/ RETROGRADES Right 06/05/2024   Procedure: CYSTOSCOPY, WITH RETROGRADE PYELOGRAM;  Surgeon: Carolee Sherwood JONETTA DOUGLAS, MD;  Location: [facility];  Service: Urology;  Laterality: Right;   CYSTOSCOPY/URETEROSCOPY/HOLMIUM LASER/STENT PLACEMENT Right 06/05/2024   Procedure: CYSTOSCOPY/URETEROSCOPY/BALLOON DILATION/STENT PLACEMENT;  Surgeon: Carolee Sherwood JONETTA DOUGLAS, MD;  Location: [facility];  Service: Urology;  Laterality: Right;   DILATION AND CURETTAGE OF UTERUS  07/18/1993   G 3 P 2     IR PARACENTESIS  07/05/2024   TUBAL LIGATION     Dr Charlie Aho   Social History   Socioeconomic History   Marital status: Media Planner    Spouse name: Not on file   Number of children: Not on file   Years of education: Not on file   Highest education level: Master's degree (e.g., MA, MS, MEng, MEd, MSW, MBA)  Occupational History   Occupation: runner, broadcasting/film/video    Comment: retired 2023  Tobacco Use   Smoking status: Never    Passive exposure: Never   Smokeless tobacco: Never  Vaping Use   Vaping status: Never Used  Substance and Sexual Activity   Alcohol use: Yes    Alcohol/week: 3.0 standard drinks of alcohol    Types: 3 Glasses of wine per week    Comment: social   Drug use: No   Sexual activity: Not Currently  Other Topics Concern   Not on file  Social History  Narrative   Exercise- Walk, body pump, tennis-  does something everyday   Social Drivers of Health   Tobacco Use: Low Risk (07/08/2024)   Patient History    Smoking Tobacco Use: Never    Smokeless Tobacco Use: Never    Passive Exposure: Never  Financial Resource Strain: Low Risk (03/04/2024)   Overall Financial Resource Strain (CARDIA)    Difficulty of Paying Living Expenses: Not very hard  Food Insecurity: No Food Insecurity (03/04/2024)   Epic    Worried About Programme Researcher, Broadcasting/film/video in the Last Year: Never true    Ran Out of Food in the Last Year: Never true  Transportation Needs: No  Transportation Needs (03/04/2024)   Epic    Lack of Transportation (Medical): No    Lack of Transportation (Non-Medical): No  Physical Activity: Sufficiently Active (03/04/2024)   Exercise Vital Sign    Days of Exercise per Week: 6 days    Minutes of Exercise per Session: 60 min  Stress: No Stress Concern Present (03/04/2024)   [employer] of Occupational Health - Occupational Stress Questionnaire    Feeling of Stress: Not at all  Social Connections: Moderately Integrated (03/04/2024)   Social Connection and Isolation Panel    Frequency of Communication with Friends and Family: More than three times a week    Frequency of Social Gatherings with Friends and Family: More than three times a week    Attends Religious Services: More than 4 times per year    Active Member of Clubs or Organizations: Yes    Attends Banker Meetings: More than 4 times per year    Marital Status: Never married  Depression (PHQ2-9): Low Risk (03/04/2024)   Depression (PHQ2-9)    PHQ-2 Score: 0  Alcohol Screen: Low Risk (03/04/2024)   Alcohol Screen    Last Alcohol Screening Score (AUDIT): 3  Housing: Low Risk (03/04/2024)   Epic    Unable to Pay for Housing in the Last Year: No    Number of Times Moved in the Last Year: 0    Homeless in the Last Year: No  Utilities: Not At Risk (03/04/2024)   Epic    Threatened with loss of utilities: No  Health Literacy: Adequate Health Literacy (03/04/2024)   B1300 Health Literacy    Frequency of need for help with medical instructions: Never    FAMILY HISTORY:  We obtained a detailed, 4-generation family history pasted below.   Karen Gilmore is unaware of relatives completing genetic testing for hereditary cancer risks.  There is no reported Ashkenazi Jewish ancestry.  There is no known consanguinity.  Pedigree Summary Mother - Breast Cancer dx. 4 Maternal Aunt - Bladder Cancer dx. 24  Maternal Great Grand mother - Pancreatic Cancer   Significant  diagnoses are listed below: Family History  Problem Relation Age of Onset   Breast cancer Mother 47   Cancer Mother    Heart attack Father 33   Heart disease Father        MI at age 78   Hyperlipidemia Father    Factor V Leiden deficiency Sister    Heart attack Paternal Grandfather        early 12s (NOT definite)   Factor V Leiden deficiency Nephew    Factor V Leiden deficiency Niece    Factor V Leiden deficiency Niece    Miscarriages / Stillbirths Daughter    Diabetes Neg Hx    Hypertension Neg Hx    Stroke Neg Hx  Colon cancer Neg Hx    Colon polyps Neg Hx     GENETIC COUNSELING ASSESSMENT: Jorden Mahl is a 68 y.o. female with a personal history of ovarian cancer which is somewhat suggestive of a hereditary cancer predisposition syndrome. We, therefore, discussed and recommended the following at today's visit.   DISCUSSION: We discussed that, in general, most cancer is not inherited in families, but instead is sporadic or familial. Sporadic cancers occur by chance and typically happen at older ages (>50 years) as this type of cancer is caused by genetic changes acquired during an individuals lifetime. Some families have more cancers than would be expected by chance; however, the ages or types of cancer are not consistent with a known genetic mutation or known genetic mutations have been ruled out. This type of familial cancer is thought to be due to a combination of multiple genetic, environmental, hormonal, and lifestyle factors. While this combination of factors likely increases the risk of cancer, the exact source of this risk is not currently identifiable or testable.  We discussed that 5-10% of cancer is the result of germline (heritable) genetic variants, with most cases associated with BRCA1/BRCA2. There are other genes that can be associated with hereditary  cancer syndromes. These include ATM, BARD1, CDH1, CHEK2, NF1, PALB2, PTEN, RAD51C, RAD51D, STK11 and TP53. We discussed  that testing is beneficial for several reasons including knowing how to follow individuals after completing their treatment, identifying whether potential treatment options such as PARP inhibitors would be beneficial, and understanding if other family members could be at risk for cancer and allow them to undergo genetic testing.   ***eviewed that up to 20-25% of ovarian cancer can be caused by an inherited genetic mutation   We reviewed the characteristics, features and inheritance patterns of hereditary cancer syndromes. We also discussed genetic testing, including the appropriate family members to test, the process of testing, insurance coverage and turn-around-time for results. We discussed the implications of a negative, positive, carrier and/or variant of uncertain significant result. Quinnetta Roepke  was offered a common hereditary cancer panel (39 genes) and an expanded pan-cancer panel (77 genes). Anett Ranker was informed of the benefits and limitations of each panel, including that expanded pan-cancer panels contain genes that do not have clear management guidelines at this point in time.  We also discussed that as the number of genes included on a panel increases, the chances of variants of uncertain significance increases.  GENETIC TESTING NATIONAL CRITERIA: Based on Aza Geiselman's personal history of ovarian cancer she meets medical criteria for genetic testing based on the [employer] (NCCN) guidelines.  Despite that she meets criteria, she  may still have an out of pocket cost. We discussed that she will get a text from [employer] with billing formation and her out-of-pocket cost estimate.    GENETIC TESTING CONSENT:  After considering the risks, benefits, and limitations, Zada Parsley ***did NOT provide***provided informed consent to pursue genetic testing. A blood sample was sent to [facility] for analysis of the CancerNext-Expanded+RNA Panel. Results should  be available within approximately 2-3 weeks' time, at which point they will be disclosed by telephone to Zada Parsley , as will any additional recommendations warranted by these results. Debara Kamphuis will receive a summary of her genetic counseling visit and a copy of her results once available. This information will also be available in Epic.  [facility] CancerNext-Expanded + RNAinsight gene panel which includes sequencing, rearrangement, and RNA analysis for the following  77 genes: AIP, ALK, APC, ATM, AXIN2, BAP1, BARD1, BMPR1A, BRCA1, BRCA2, BRIP1, CDC73, CDH1, CDK4, CDKN1B, CDKN2A, CEBPA, CHEK2, CTNNA1, DDX41, DICER1, ETV6, FH, FLCN, GATA2, LZTR1, MAX, MBD4, MEN1, MET, MLH1, MSH2, MSH3, MSH6, MUTYH, NF1, NF2, NTHL1, PALB2, PHOX2B, PMS2, POT1, PRKAR1A, PTCH1, PTEN, RAD51C, RAD51D, RB1, RET, RPS20, RUNX1, SDHA, SDHAF2, SDHB, SDHC, SDHD, SMAD4, SMARCA4, SMARCB1, SMARCE1, STK11, SUFU, TMEM127, TP53, TSC1, TSC2, VHL, and WT1 (sequencing and deletion/duplication); EGFR, HOXB13, KIT, MITF, PDGFRA, POLD1, and POLE (sequencing only); EPCAM and GREM1 (deletion/duplication only).   GENETIC INFORMATION NONDISCRIMINATION ACT (GINA): We discussed that some people do not want to undergo genetic testing due to fear of genetic discrimination.  The Genetic Information Nondiscrimination Act (GINA) was signed into federal law in 2008. GINA prohibits health insurers and most employers from discriminating against individuals based on genetic information (including the results of genetic tests and family history information). According to GINA, health insurance companies cannot consider genetic information to be a preexisting condition, nor can they use it to make decisions regarding coverage or rates. GINA also makes it illegal for most employers to use genetic information in making decisions about hiring, firing, promotion, or terms of employment. It is important to note that GINA does not offer protections for life insurance,  disability insurance, or long-term care insurance. GINA does not apply to those in the [employer], those who work for companies with less than 15 employees, and new life insurance or long-term disability insurance policies.  Health status due to a cancer diagnosis is not protected under GINA. More information about GINA can be found by visiting eliteclients.be. Lastly, we encouraged Yarelie Hams to remain in contact with cancer genetics annually so that we can continuously update the family history and inform her of any changes in cancer genetics and testing that may be of benefit for this family.   Alianys Authier's questions were answered to her satisfaction today. Our contact information was provided should additional questions or concerns arise. Thank you for the referral and allowing us  to share in the care of your patient.   Resources:  Hermione Havlicek was provided with the following:  [facility] Billing information  [facility] Hereditary Cancer Testing Patient Guide [facility] CancerNext-Expanded + RNAinsight gene list  PLAN:  Testing Ordered: [facility] CancerNext-Expanded + RNAinsight  Clinic Note Faxed/Routed to [employer] PCP Antonio Meth, Jamee SAUNDERS, DO   Nikelle Malatesta Lindsey-Mills, MS, [facility]  Certified Genetic Counselor  Email: [email or web address]  Phone: [phone]  I personally spent a total of 60 minutes in the care of the patient today including preparing to see the patient, counseling and educating, placing orders, and documenting clinical information in the EHR.  The patient was joined by her husband Acupuncturist. Drs. Lanny Stalls, and/or Gudena were available for questions, if needed. _______________________________________________________________________ For Office Staff:  Number of people involved in session: 2 Was an Intern/ student involved with case: no

## 2024-07-10 ENCOUNTER — Telehealth: Payer: Self-pay | Admitting: Oncology

## 2024-07-10 NOTE — Telephone Encounter (Signed)
 Called and notified [facility] of appointment with Dr. Viktoria on 09/13/24 at 8:30 to discuss surgery.

## 2024-07-12 ENCOUNTER — Encounter: Payer: Self-pay | Admitting: Gynecologic Oncology

## 2024-07-12 LAB — CYTOLOGY - NON PAP

## 2024-07-13 ENCOUNTER — Encounter: Payer: Self-pay | Admitting: Gynecologic Oncology

## 2024-07-15 ENCOUNTER — Inpatient Hospital Stay

## 2024-07-15 ENCOUNTER — Other Ambulatory Visit: Payer: Self-pay

## 2024-07-15 DIAGNOSIS — Z803 Family history of malignant neoplasm of breast: Secondary | ICD-10-CM

## 2024-07-15 DIAGNOSIS — C561 Malignant neoplasm of right ovary: Secondary | ICD-10-CM

## 2024-07-15 DIAGNOSIS — Z800 Family history of malignant neoplasm of digestive organs: Secondary | ICD-10-CM

## 2024-07-15 DIAGNOSIS — Z8052 Family history of malignant neoplasm of bladder: Secondary | ICD-10-CM

## 2024-07-15 LAB — GENETIC SCREENING ORDER

## 2024-07-15 NOTE — Progress Notes (Signed)
 Pharmacist Chemotherapy Monitoring - Initial Assessment    Anticipated start date: 07/22/24   The following has been reviewed per standard work regarding the patient's treatment regimen: The patient's diagnosis, treatment plan and drug doses, and organ/hematologic function Lab orders and baseline tests specific to treatment regimen  The treatment plan start date, drug sequencing, and pre-medications Prior authorization status  Patient's documented medication list, including drug-drug interaction screen and prescriptions for anti-emetics and supportive care specific to the treatment regimen The drug concentrations, fluid compatibility, administration routes, and timing of the medications to be used The patient's access for treatment and lifetime cumulative dose history, if applicable  The patient's medication allergies and previous infusion related reactions, if applicable   Changes made to treatment plan:  N/A  Follow up needed:  Release of take-home Compazine and dexamethasone  Pending auth for treatment Port placement scheduled 07/16/24  Harlene JONELLE Nasuti, RPH, 07/15/2024  12:44 PM

## 2024-07-16 ENCOUNTER — Encounter: Payer: Self-pay | Admitting: Hematology and Oncology

## 2024-07-16 ENCOUNTER — Ambulatory Visit (HOSPITAL_COMMUNITY)
Admission: RE | Admit: 2024-07-16 | Discharge: 2024-07-16 | Disposition: A | Discharge status: Home / Self Care | Source: Ambulatory Visit | Attending: Gynecologic Oncology | Admitting: Gynecologic Oncology

## 2024-07-16 ENCOUNTER — Other Ambulatory Visit: Payer: Self-pay

## 2024-07-16 ENCOUNTER — Inpatient Hospital Stay: Admitting: Hematology and Oncology

## 2024-07-16 ENCOUNTER — Inpatient Hospital Stay

## 2024-07-16 VITALS — BP 134/76 | HR 75 | Temp 98.8°F | Resp 18 | Ht 64.0 in | Wt 129.0 lb

## 2024-07-16 DIAGNOSIS — C579 Malignant neoplasm of female genital organ, unspecified: Secondary | ICD-10-CM | POA: Diagnosis present

## 2024-07-16 DIAGNOSIS — K5909 Other constipation: Secondary | ICD-10-CM

## 2024-07-16 DIAGNOSIS — C569 Malignant neoplasm of unspecified ovary: Secondary | ICD-10-CM | POA: Insufficient documentation

## 2024-07-16 DIAGNOSIS — K5900 Constipation, unspecified: Secondary | ICD-10-CM | POA: Diagnosis not present

## 2024-07-16 DIAGNOSIS — C561 Malignant neoplasm of right ovary: Secondary | ICD-10-CM | POA: Diagnosis not present

## 2024-07-16 DIAGNOSIS — N1330 Unspecified hydronephrosis: Secondary | ICD-10-CM

## 2024-07-16 DIAGNOSIS — R971 Elevated cancer antigen 125 [CA 125]: Secondary | ICD-10-CM | POA: Diagnosis not present

## 2024-07-16 DIAGNOSIS — R180 Malignant ascites: Secondary | ICD-10-CM | POA: Diagnosis not present

## 2024-07-16 DIAGNOSIS — C786 Secondary malignant neoplasm of retroperitoneum and peritoneum: Secondary | ICD-10-CM | POA: Diagnosis present

## 2024-07-16 DIAGNOSIS — R918 Other nonspecific abnormal finding of lung field: Secondary | ICD-10-CM | POA: Insufficient documentation

## 2024-07-16 DIAGNOSIS — I700 Atherosclerosis of aorta: Secondary | ICD-10-CM | POA: Diagnosis not present

## 2024-07-16 HISTORY — PX: IR IMAGING GUIDED PORT INSERTION: IMG5740

## 2024-07-16 LAB — CBC WITH DIFFERENTIAL (CANCER CENTER ONLY)
Abs Immature Granulocytes: 0.01 K/uL (ref 0.00–0.07)
Basophils Absolute: 0.1 K/uL (ref 0.0–0.1)
Basophils Relative: 1 %
Eosinophils Absolute: 0.3 K/uL (ref 0.0–0.5)
Eosinophils Relative: 5 %
HCT: 39.5 % (ref 36.0–46.0)
Hemoglobin: 13 g/dL (ref 12.0–15.0)
Immature Granulocytes: 0 %
Lymphocytes Relative: 21 %
Lymphs Abs: 1.4 K/uL (ref 0.7–4.0)
MCH: 30.2 pg (ref 26.0–34.0)
MCHC: 32.9 g/dL (ref 30.0–36.0)
MCV: 91.9 fL (ref 80.0–100.0)
Monocytes Absolute: 0.8 K/uL (ref 0.1–1.0)
Monocytes Relative: 12 %
Neutro Abs: 4.1 K/uL (ref 1.7–7.7)
Neutrophils Relative %: 61 %
Platelet Count: 417 K/uL — ABNORMAL HIGH (ref 150–400)
RBC: 4.3 MIL/uL (ref 3.87–5.11)
RDW: 13.5 % (ref 11.5–15.5)
WBC Count: 6.7 K/uL (ref 4.0–10.5)
nRBC: 0 % (ref 0.0–0.2)

## 2024-07-16 LAB — CMP (CANCER CENTER ONLY)
ALT: 34 U/L (ref 0–44)
AST: 58 U/L — ABNORMAL HIGH (ref 15–41)
Albumin: 3.9 g/dL (ref 3.5–5.0)
Alkaline Phosphatase: 113 U/L (ref 38–126)
Anion gap: 9 (ref 5–15)
BUN: 19 mg/dL (ref 8–23)
CO2: 27 mmol/L (ref 22–32)
Calcium: 9.3 mg/dL (ref 8.9–10.3)
Chloride: 101 mmol/L (ref 98–111)
Creatinine: 1.08 mg/dL — ABNORMAL HIGH (ref 0.44–1.00)
GFR, Estimated: 56 mL/min — ABNORMAL LOW
Glucose, Bld: 97 mg/dL (ref 70–99)
Potassium: 4.2 mmol/L (ref 3.5–5.1)
Sodium: 137 mmol/L (ref 135–145)
Total Bilirubin: 0.3 mg/dL (ref 0.0–1.2)
Total Protein: 7.5 g/dL (ref 6.5–8.1)

## 2024-07-16 MED ORDER — MIDAZOLAM HCL 2 MG/2ML IJ SOLN
INTRAMUSCULAR | Status: AC
  Filled 2024-07-16: qty 4

## 2024-07-16 MED ORDER — LIDOCAINE HCL 1 % IJ SOLN
INTRAMUSCULAR | Status: AC
  Filled 2024-07-16: qty 20

## 2024-07-16 MED ORDER — IOHEXOL 300 MG/ML  SOLN
75.0000 mL | Freq: Once | INTRAMUSCULAR | Status: AC | PRN
  Administered 2024-07-16: 75 mL via INTRAVENOUS

## 2024-07-16 MED ORDER — HEPARIN SOD (PORK) LOCK FLUSH 100 UNIT/ML IV SOLN
INTRAVENOUS | Status: AC
  Filled 2024-07-16: qty 5

## 2024-07-16 MED ORDER — MIDAZOLAM HCL (PF) 2 MG/2ML IJ SOLN
INTRAMUSCULAR | Status: AC | PRN
  Administered 2024-07-16 (×2): 1 mg via INTRAVENOUS
  Administered 2024-07-16 (×2): .5 mg via INTRAVENOUS

## 2024-07-16 MED ORDER — FENTANYL CITRATE (PF) 100 MCG/2ML IJ SOLN
INTRAMUSCULAR | Status: AC
  Filled 2024-07-16: qty 2

## 2024-07-16 MED ORDER — DEXAMETHASONE 4 MG PO TABS: ORAL_TABLET | ORAL | 6 refills | Status: AC

## 2024-07-16 MED ORDER — PROCHLORPERAZINE MALEATE 10 MG PO TABS: 10.0000 mg | ORAL_TABLET | Freq: Four times a day (QID) | ORAL | 1 refills | Status: AC | PRN

## 2024-07-16 MED ORDER — SODIUM CHLORIDE 0.9 % IV SOLN: INTRAVENOUS | Status: DC

## 2024-07-16 MED ORDER — LIDOCAINE HCL 1 % IJ SOLN
20.0000 mL | Freq: Once | INTRAMUSCULAR | Status: AC
  Administered 2024-07-16: 10 mL via INTRADERMAL

## 2024-07-16 MED ORDER — FENTANYL CITRATE (PF) 100 MCG/2ML IJ SOLN
INTRAMUSCULAR | Status: AC | PRN
  Administered 2024-07-16: 50 ug via INTRAVENOUS

## 2024-07-16 MED ORDER — ONDANSETRON HCL 8 MG PO TABS: 8.0000 mg | ORAL_TABLET | Freq: Three times a day (TID) | ORAL | 1 refills | Status: AC | PRN

## 2024-07-16 MED ORDER — HEPARIN SOD (PORK) LOCK FLUSH 100 UNIT/ML IV SOLN
500.0000 [IU] | Freq: Once | INTRAVENOUS | Status: AC
  Administered 2024-07-16: 500 [IU] via INTRAVENOUS

## 2024-07-16 MED ORDER — LIDOCAINE-PRILOCAINE 2.5-2.5 % EX CREA: TOPICAL_CREAM | CUTANEOUS | 3 refills | Status: AC

## 2024-07-16 MED ORDER — LIDOCAINE-EPINEPHRINE 1 %-1:100000 IJ SOLN
20.0000 mL | Freq: Once | INTRAMUSCULAR | Status: AC
  Administered 2024-07-16: 10 mL via INTRADERMAL

## 2024-07-16 MED ORDER — LIDOCAINE-EPINEPHRINE 1 %-1:100000 IJ SOLN
INTRAMUSCULAR | Status: AC
  Filled 2024-07-16: qty 1

## 2024-07-16 NOTE — Progress Notes (Signed)
**Note Karen-Identified via Obfuscation**  Karen [facility] CONSULT NOTE  Patient Care Team: Karen Gilmore, Karen SAUNDERS, DO as PCP - General (Family Medicine) Karen Rigg, MD as Consulting Physician (Dermatology) Karen Gilmore, Karen Gilmore, OD (Optometry) Ob/Gyn, Karen Gilmore as Consulting Physician Karen Browning, MD as Consulting Physician (Obstetrics and Gynecology)  ASSESSMENT & PLAN:  Ovarian cancer on right [facility] Gilmore) The patient was diagnosed with what looks like stage IIIc right ovarian cancer after presentation with lower back pain in October.  The mass is causing right sided hydronephrosis.  She presented with abnormal imaging study and trace ascites.  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 The intent is of curative intent.  We discussed some of the risks, benefits, side-effects of carboplatin & Taxol. Treatment is intravenous, every 3 weeks x 6 cycles  Some of the short term side-effects included, though not limited to, including weight loss, life threatening infections, risk of allergic reactions, need for transfusions of blood products, nausea, vomiting, change in bowel habits, loss of hair, admission to hospital for various reasons, and risks of death.   Long term side-effects are also discussed including risks of infertility, permanent damage to nerve function, hearing loss, chronic fatigue, kidney damage with possibility needing hemodialysis, and rare secondary malignancy including bone marrow disorders.  The patient is aware that the response rates discussed earlier is not guaranteed.  After a long discussion, patient made an informed decision to proceed with the prescribed plan of care.    Patient education material was dispensed. We discussed premedication with dexamethasone  before chemotherapy. I recommend 3 cycles of treatment followed by repeat imaging study and possible debulking surgery  She had successful port placement today I will order labs today She will undergo chemo education class tomorrow She is scheduled for first dose of chemotherapy next week I will see her the following week for supportive care and further evaluation to see whether she needs further paracentesis  Malignant ascites (HCC) She has persistent ascites on exam She is mildly symptomatic I will tentatively schedule her for repeat paracentesis on Friday but we will call her in the morning to see if she wants to proceed on not  Hydronephrosis of right kidney Renal function is stable Observe closely  Other constipation We discussed the importance of monitoring for regular bowel movement  Orders Placed This Encounter  Procedures   US  Paracentesis    Standing Status:   Future    Expected Date:   07/19/2024    Expiration Date:   07/16/2025    If therapeutic, is there a maximum amount of fluid to be removed?:   Yes    What is the maximum amount of fluide to be removed?:   5 liter    Are labs required for specimen collection?:   No    Is Albumin medication needed?:   No    Reason for Exam (SYMPTOM  OR DIAGNOSIS REQUIRED):   malignant ascites    Preferred imaging location?:   [facility]   [facility]    Standing Status:   Standing    Number of Occurrences:   11    Expiration Date:   07/16/2025   CMP ([facility] only)    Standing Status:  Future    Number of Occurrences:   1    Expiration Date:   07/16/2025   CBC with Differential ([facility] Only)    Standing Status:   Future    Number of Occurrences:   1    Expiration Date:   07/16/2025    The total time spent in the appointment was 80 minutes encounter with patients including review of chart and various tests results,  discussions about plan of care and coordination of care plan   All questions were answered. The patient knows to call the clinic with any problems, questions or concerns. No barriers to learning was detected.  Karen Bedford, MD 12/30/20256:48 PM  CHIEF COMPLAINTS/PURPOSE OF CONSULTATION:  Right ovarian cancer with malignant ascites and right sided hydronephrosis  HISTORY OF PRESENTING ILLNESS:  Karen Gilmore 68 y.o. female is here because of recent diagnosis of primary GYN cancer She is here accompanied by her partner I reviewed her records extensively She initially presented to an emergency department in [place]  after concern for lower back pain and concern for kidney stone She underwent 2 different imaging study of which I was able to review electronically and compared with her recent imaging study She was subsequently referred to urologist for management of right sided hydronephrosis The cause was unknown at this point Then she went to the emergency department in December and had extensive evaluation including repeat imaging study which now confirmed recurrent malignant ascites and right pelvic mass She underwent paracentesis and cytology of the fluid came back positive for malignancy likely from GYN primary She has noted some slight change in her bowel movement recently but in general she had regular bowel movement daily She has no problem with urination Has mild abdominal bloating and discomfort, alleviated by recent paracentesis Denies recent nausea vomiting She denies recent abnormal weight loss  I have reviewed her chart and materials related to her cancer extensively and collaborated history with the patient. Summary of oncologic history is as follows: Oncology History  Ovarian cancer on right [facility])  05/10/2024 Initial Diagnosis   She presented to emergency department in [place]  with lower back pain.  CT imaging showed evidence of right hydronephrosis and nonspecific ascites    06/05/2024 Pathology Results   CYTOLOGY - NON PAP  CASE: [id number]  PATIENT: Karen Gilmore  Non-Gynecological Cytology Report      Clinical History: H/o abdominal pain/ distention, right adnexal mass,  ascites.  Specimen Submitted:  A. ASCITES, PARACENTESIS:   FINAL MICROSCOPIC DIAGNOSIS:  - Adenocarcinoma  - Favor gynecologic primary   SPECIMEN ADEQUACY:  Satisfactory for evaluation   DIAGNOSTIC COMMENTS:  The specimen contains cellular glandular proliferation formal small glands with cytoplasmic vacuoles. Immunohistochemical stains were performed to characterize the cells. The cells are positive for PAX8 and WT1, and are negative for Napsin A. P53 is overexpressed in some cells but overall appears to be wild type pattern of staining. 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     06/05/2024 Surgery   Operative Note   Preoperative diagnosis:  1.  Right hydronephrosis   Postoperative diagnosis: 1.  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  Cystoscopy with right retrograde pyelogram, right diagnostic ureteroscopy, right ureteral balloon dilation, right ureteral stent placement   Surgeon: Sherwood Edison, MD    Complications: None immediate  Drains/Catheters: 1.  6 x 24 double-J ureteral stent   Intraoperative findings: 1.  Normal urethra bladder    2.  Right retrograde pyelogram revealed hydroureteronephrosis down to the mid to distal ureter where there was a clear transition point.  She also had significant tortuosity of the proximal ureter/ureteropelvic junction but there was no tumor, stone, or stricture proximally   3.  Diagnostic ureteroscopy confirmed presence of a mild circumferential ureteral stricture that was dilated to 15 French with balloon dilator for 5 minutes.     07/05/2024 Imaging   IR Paracentesis Result Date: 07/05/2024 INDICATION: Patient with history of abdominal  pain/distention, recent imaging revealing right adnexal mass, ascites, omental caking, right hydronephrosis. Request received for diagnostic and therapeutic paracentesis. EXAM: ULTRASOUND GUIDED DIAGNOSTIC AND THERAPEUTIC PARACENTESIS MEDICATIONS: 8 mL 1% lidocaine  with epinephrine  to skin/subcutaneous tissue COMPLICATIONS: None immediate. PROCEDURE: Informed written consent was obtained from the patient after a discussion of the risks, benefits and alternatives to treatment. A timeout was performed prior to the initiation of the procedure. Initial ultrasound scanning demonstrates a moderate to large amount of ascites within the right mid to lower abdominal quadrant. The right mid to lower abdomen was prepped and draped in the usual sterile fashion. 1% lidocaine  with epinephrine  was used for local anesthesia. Following this, a 6 Fr Safe-T-Centesis catheter was introduced. An ultrasound image was saved for documentation purposes. The paracentesis was performed. The catheter was removed and a dressing was applied. The patient tolerated the procedure well without immediate post procedural complication. FINDINGS: A total of approximately 4 liters of slightly hazy, yellow fluid was removed. Samples were sent to the laboratory as requested by the clinical team. IMPRESSION: Successful ultrasound-guided diagnostic and therapeutic paracentesis yielding 4 liters of peritoneal fluid. Performed by: Franky Rakers, PA-C Electronically Signed   By: Juliene Balder M.D.   On: 07/05/2024 17:46   CT ABDOMEN PELVIS W CONTRAST Result Date: 07/05/2024 CLINICAL DATA:  Abdominal pain, acute, nonlocalized. * Tracking Code: BO * EXAM: CT ABDOMEN AND PELVIS WITH CONTRAST TECHNIQUE: Multidetector CT imaging of the abdomen and pelvis was performed using the standard protocol following bolus administration of intravenous contrast. RADIATION DOSE REDUCTION: This exam was performed according to the departmental dose-optimization program which  includes automated exposure control, adjustment of the mA and/or kV according to patient size and/or use of iterative reconstruction technique. CONTRAST:  80mL OMNIPAQUE  IOHEXOL  300 MG/ML  SOLN COMPARISON:  None Available. FINDINGS: Lower chest: The lung bases are clear. No pleural effusion. The heart is normal in size. No pericardial effusion. Hepatobiliary: The liver is normal in size. Non-cirrhotic configuration. No suspicious mass. These is mild diffuse hepatic steatosis. No intrahepatic or extrahepatic bile duct dilation. No calcified gallstones. Normal gallbladder wall thickness. No pericholecystic inflammatory changes. Pancreas: Unremarkable. No pancreatic ductal dilatation or surrounding inflammatory changes. Spleen: Within normal limits. No focal lesion. Adrenals/Urinary Tract: Adrenal glands are unremarkable. No suspicious renal mass. There is moderate right hydronephrosis and hydroureter up to the level of right lower ureter where there is a heterogeneously enhancing right adnexal mass compressing over the right ureter. No left hydroureteronephrosis. No nephroureterolithiasis on either side. Urinary bladder is decompressed and pushed anteriorly. Not well evaluated due to underdistention. Stomach/Bowel: There is a tiny sliding hiatal hernia. No disproportionate dilation of the small or large bowel loops. No evidence of abnormal bowel wall thickening or inflammatory changes. The appendix is unremarkable. Vascular/Lymphatic: There is moderate to large ascites. There are irregular hyperattenuating deposits along the peritoneal reflections especially in the dependent pelvis. There is also marked thickening/extensive omental caking, compatible with peritoneal carcinomatosis. There is heterogeneous soft tissue in the peripancreatic  region concerning for lymphadenopathy. No aneurysmal dilation of the major abdominal arteries. Reproductive: Normal-size anteverted uterus. However, there is heterogeneous 3.1 x 5.1  cm hyperattenuating mass in the right adnexa. Normal right ovary is not distinctly seen. Findings favor tumor implants covering the right ovary. Left ovary is not distinctly visualized. Other: There is a tiny fat containing umbilical hernia. The soft tissues and abdominal wall are otherwise unremarkable. Musculoskeletal: No suspicious osseous lesions. There are mild multilevel degenerative changes in the visualized spine. IMPRESSION: 1. There is a heterogeneous 3.1 x 5.1 cm hyperattenuating mass in the right adnexa. Normal right ovary is not distinctly seen. Findings favor primary ovarian malignancy versus tumor implants covering the right ovary. 2. There is moderate to large ascites with irregular hyperattenuating deposits along the peritoneal reflections especially in the dependent pelvis. There is also marked thickening/extensive omental caking, compatible with peritoneal carcinomatosis. 3. There is moderate right hydronephrosis and hydroureter up to the level of right lower ureter where there is a heterogeneously enhancing right adnexal mass, most likely compressing right lower ureter. No left hydroureteronephrosis. No nephroureterolithiasis on either side. 4. Multiple other nonacute observations, as described above. Electronically Signed   By: Ree Molt M.D.   On: 07/05/2024 14:56      07/05/2024 Tumor Marker   Patient's tumor was tested for the following markers: CA-125. Results of the tumor marker test revealed 3753.   07/07/2024 Initial Diagnosis   Ovarian cancer on right (HCC)   07/22/2024 -  Chemotherapy   Patient is on Treatment Plan : OVARIAN Carboplatin (AUC 6) + Paclitaxel (175) q21d X 6 Cycles       MEDICAL HISTORY:  Past Medical History:  Diagnosis Date   Allergy 2018   Chlorhexidine  - discovered when used to clean before donating blood   Constipation    Rectal bleeding    Rectal pain     SURGICAL HISTORY: Past Surgical History:  Procedure Laterality Date   COLONOSCOPY   07/18/2004   hemorrhoids;[facility]   CYSTOSCOPY W/ RETROGRADES Right 06/05/2024   Procedure: CYSTOSCOPY, WITH RETROGRADE PYELOGRAM;  Surgeon: Carolee Sherwood JONETTA DOUGLAS, MD;  Location: [facility];  Service: Urology;  Laterality: Right;   CYSTOSCOPY/URETEROSCOPY/HOLMIUM LASER/STENT PLACEMENT Right 06/05/2024   Procedure: CYSTOSCOPY/URETEROSCOPY/BALLOON DILATION/STENT PLACEMENT;  Surgeon: Carolee Sherwood JONETTA DOUGLAS, MD;  Location: [facility];  Service: Urology;  Laterality: Right;   DILATION AND CURETTAGE OF UTERUS  07/18/1993   G 3 P 2     IR PARACENTESIS  07/05/2024   TUBAL LIGATION     Dr Charlie Aho    SOCIAL HISTORY: Social History   Socioeconomic History   Marital status: Media Planner    Spouse name: Not on file   Number of children: Not on file   Years of education: Not on file   Highest education level: Master's degree (e.g., MA, MS, MEng, MEd, MSW, MBA)  Occupational History   Occupation: runner, broadcasting/film/video    Comment: retired 2023  Tobacco Use   Smoking status: Never    Passive exposure: Never   Smokeless tobacco: Never  Vaping Use   Vaping status: Never Used  Substance and Sexual Activity   Alcohol use: Yes    Alcohol/week: 3.0 standard drinks of alcohol    Types: 3 Glasses of wine per week    Comment: social   Drug use: No   Sexual activity: Not Currently  Other Topics Concern   Not on file  Social History Narrative   Exercise- Walk, body pump, tennis-  does something everyday   Social Drivers of Health   Tobacco Use: Low Risk (07/08/2024)   Patient History    Smoking Tobacco Use: Never    Smokeless Tobacco Use: Never    Passive Exposure: Never  Financial Resource Strain: Low Risk (03/04/2024)   Overall Financial Resource Strain (CARDIA)    Difficulty of Paying Living Expenses: Not very hard  Food Insecurity: No Food Insecurity (07/15/2024)   Epic    Worried About Programme Researcher, Broadcasting/film/video in the Last Year: Never true    Ran Out of Food in the Last Year: Never true  Transportation  Needs: No Transportation Needs (07/15/2024)   Epic    Lack of Transportation (Medical): No    Lack of Transportation (Non-Medical): No  Physical Activity: Sufficiently Active (03/04/2024)   Exercise Vital Sign    Days of Exercise per Week: 6 days    Minutes of Exercise per Session: 60 min  Stress: No Stress Concern Present (03/04/2024)   [employer] of Occupational Health - Occupational Stress Questionnaire    Feeling of Stress: Not at all  Social Connections: Moderately Integrated (03/04/2024)   Social Connection and Isolation Panel    Frequency of Communication with Friends and Family: More than three times a week    Frequency of Social Gatherings with Friends and Family: More than three times a week    Attends Religious Services: More than 4 times per year    Active Member of Clubs or Organizations: Yes    Attends Banker Meetings: More than 4 times per year    Marital Status: Never married  Intimate Partner Violence: Not At Risk (03/04/2024)   Epic    Fear of Current or Ex-Partner: No    Emotionally Abused: No    Physically Abused: No    Sexually Abused: No  Depression (PHQ2-9): Low Risk (03/04/2024)   Depression (PHQ2-9)    PHQ-2 Score: 0  Alcohol Screen: Low Risk (03/04/2024)   Alcohol Screen    Last Alcohol Screening Score (AUDIT): 3  Housing: Low Risk (07/15/2024)   Epic    Unable to Pay for Housing in the Last Year: No    Number of Times Moved in the Last Year: 0    Homeless in the Last Year: No  Utilities: Not At Risk (07/15/2024)   Epic    Threatened with loss of utilities: No  Health Literacy: Adequate Health Literacy (03/04/2024)   B1300 Health Literacy    Frequency of need for help with medical instructions: Never    FAMILY HISTORY: Family History  Problem Relation Age of Onset   Breast cancer Mother 31   Heart attack Father 50   Heart disease Father        MI at age 37   Hyperlipidemia Father    Factor V Leiden deficiency Sister    Heart  attack Paternal Grandfather        early 80s (NOT definite)   Miscarriages / Stillbirths Daughter    Factor V Leiden deficiency Data Processing Manager V Leiden deficiency Niece    Factor V Leiden deficiency Niece    Pancreatic cancer Maternal Great-grandmother    Bladder Cancer Maternal Aunt    Diabetes Neg Hx    Hypertension Neg Hx    Stroke Neg Hx    Colon cancer Neg Hx    Colon polyps Neg Hx     ALLERGIES:  is allergic to chlorhexidine  gluconate.  MEDICATIONS:  Current Outpatient Medications  Medication Sig Dispense Refill   dexamethasone  (DECADRON ) 4 MG tablet Take 2 tabs at the night before and 2 tab the morning of chemotherapy, every 3 weeks, by mouth x 6 cycles 24 tablet 6   fluticasone  (FLONASE ) 50 MCG/ACT nasal spray Place 2 sprays into both nostrils daily. 16 g 5   HYDROcodone -acetaminophen  (NORCO/VICODIN) 5-325 MG tablet Take 1 tablet by mouth every 6 (six) hours as needed. 10 tablet 0   lidocaine -prilocaine (EMLA) cream Apply to affected area once 30 g 3   Multiple Vitamin (MULTIVITAMIN) tablet Take 1 tablet by mouth daily.     Omega-3 Fatty Acids (FISH OIL) 1000 MG CAPS Take 1,000 mg by mouth daily.     ondansetron  (ZOFRAN ) 4 MG tablet Take 1 tablet (4 mg total) by mouth every 6 (six) hours. 12 tablet 0   ondansetron  (ZOFRAN ) 8 MG tablet Take 1 tablet (8 mg total) by mouth every 8 (eight) hours as needed for nausea or vomiting. Start on the third day after carboplatin. 30 tablet 1   oxyCODONE -acetaminophen  (PERCOCET/ROXICET) 5-325 MG tablet Take 1 tablet by mouth every 6 (six) hours as needed for severe pain (pain score 7-10). 15 tablet 0   prochlorperazine (COMPAZINE) 10 MG tablet Take 1 tablet (10 mg total) by mouth every 6 (six) hours as needed for nausea or vomiting. 30 tablet 1   No current facility-administered medications for this visit.   Facility-Administered Medications Ordered in Other Visits  Medication Dose Route Frequency Provider Last Rate Last Admin   0.9 %   sodium chloride  infusion   Intravenous Continuous Carim, Charles A, PA-C        REVIEW OF SYSTEMS:   Constitutional: Denies fevers, chills or abnormal night sweats Eyes: Denies blurriness of vision, double vision or watery eyes Ears, nose, mouth, throat, and face: Denies mucositis or sore throat Respiratory: Denies cough, dyspnea or wheezes Cardiovascular: Denies palpitation, chest discomfort or lower extremity swelling Gastrointestinal:  Denies nausea, heartburn or change in bowel habits Skin: Denies abnormal skin rashes Lymphatics: Denies new lymphadenopathy or easy bruising Neurological:Denies numbness, tingling or new weaknesses Behavioral/Psych: Mood is stable, no new changes  All other systems were reviewed with the patient and are negative.  PHYSICAL EXAMINATION: ECOG PERFORMANCE STATUS: 1 - Symptomatic but completely ambulatory  Vitals:   07/16/24 1339  BP: 134/76  Pulse: 75  Resp: 18  Temp: 98.8 F (37.1 C)  SpO2: 100%   Filed Weights   07/16/24 1339  Weight: 129 lb (58.5 kg)    GENERAL:alert, no distress and comfortable SKIN: skin color, texture, turgor are normal, no rashes or significant lesions EYES: normal, conjunctiva are pink and non-injected, sclera clear OROPHARYNX:no exudate, no erythema and lips, buccal mucosa, and tongue normal  NECK: supple, thyroid  normal size, non-tender, without nodularity LYMPH:  no palpable lymphadenopathy in the cervical, axillary or inguinal LUNGS: clear to auscultation and percussion with normal breathing effort HEART: regular rate & rhythm and no murmurs and no lower extremity edema ABDOMEN:abdomen soft, non-tender and normal bowel sounds.  Noted slight abdominal distention and positive fluid shift consistent with residual ascites Musculoskeletal:no cyanosis of digits and no clubbing  PSYCH: alert & oriented x 3 with fluent speech NEURO: no focal motor/sensory deficits  LABORATORY DATA:  I have reviewed the data as  listed Lab Results  Component Value Date   WBC 6.7 07/16/2024   HGB 13.0 07/16/2024   HCT 39.5 07/16/2024   MCV 91.9 07/16/2024   PLT 417 (H) 07/16/2024  Recent Labs    07/27/23 1007 05/28/24 1444 07/05/24 1123 07/16/24 1523  NA 137 140 138 137  K 4.7 4.4 4.7 4.2  CL 100 103 102 101  CO2 29 28 26 27   GLUCOSE 84 94 98 97  BUN 18 16 13 19   CREATININE 0.78 0.76 0.72 1.08*  CALCIUM 9.5 9.6 9.3 9.3  GFRNONAA  --  >60 >60 56*  PROT 7.0  --  7.0 7.5  ALBUMIN 4.5  --  3.8 3.9  AST 21  --  99* 58*  ALT 16  --  67* 34  ALKPHOS 97  --  105 113  BILITOT 0.5  --  0.3 0.3    RADIOGRAPHIC STUDIES: I have personally reviewed the radiological images as listed and agreed with the findings in the report. IR Paracentesis Result Date: 07/05/2024 INDICATION: Patient with history of abdominal pain/distention, recent imaging revealing right adnexal mass, ascites, omental caking, right hydronephrosis. Request received for diagnostic and therapeutic paracentesis. EXAM: ULTRASOUND GUIDED DIAGNOSTIC AND THERAPEUTIC PARACENTESIS MEDICATIONS: 8 mL 1% lidocaine  with epinephrine  to skin/subcutaneous tissue COMPLICATIONS: None immediate. PROCEDURE: Informed written consent was obtained from the patient after a discussion of the risks, benefits and alternatives to treatment. A timeout was performed prior to the initiation of the procedure. Initial ultrasound scanning demonstrates a moderate to large amount of ascites within the right mid to lower abdominal quadrant. The right mid to lower abdomen was prepped and draped in the usual sterile fashion. 1% lidocaine  with epinephrine  was used for local anesthesia. Following this, a 6 Fr Safe-T-Centesis catheter was introduced. An ultrasound image was saved for documentation purposes. The paracentesis was performed. The catheter was removed and a dressing was applied. The patient tolerated the procedure well without immediate post procedural complication. FINDINGS: A  total of approximately 4 liters of slightly hazy, yellow fluid was removed. Samples were sent to the laboratory as requested by the clinical team. IMPRESSION: Successful ultrasound-guided diagnostic and therapeutic paracentesis yielding 4 liters of peritoneal fluid. Performed by: Franky Rakers, PA-C Electronically Signed   By: Juliene Balder M.D.   On: 07/05/2024 17:46   CT ABDOMEN PELVIS W CONTRAST Result Date: 07/05/2024 CLINICAL DATA:  Abdominal pain, acute, nonlocalized. * Tracking Code: BO * EXAM: CT ABDOMEN AND PELVIS WITH CONTRAST TECHNIQUE: Multidetector CT imaging of the abdomen and pelvis was performed using the standard protocol following bolus administration of intravenous contrast. RADIATION DOSE REDUCTION: This exam was performed according to the departmental dose-optimization program which includes automated exposure control, adjustment of the mA and/or kV according to patient size and/or use of iterative reconstruction technique. CONTRAST:  80mL OMNIPAQUE  IOHEXOL  300 MG/ML  SOLN COMPARISON:  None Available. FINDINGS: Lower chest: The lung bases are clear. No pleural effusion. The heart is normal in size. No pericardial effusion. Hepatobiliary: The liver is normal in size. Non-cirrhotic configuration. No suspicious mass. These is mild diffuse hepatic steatosis. No intrahepatic or extrahepatic bile duct dilation. No calcified gallstones. Normal gallbladder wall thickness. No pericholecystic inflammatory changes. Pancreas: Unremarkable. No pancreatic ductal dilatation or surrounding inflammatory changes. Spleen: Within normal limits. No focal lesion. Adrenals/Urinary Tract: Adrenal glands are unremarkable. No suspicious renal mass. There is moderate right hydronephrosis and hydroureter up to the level of right lower ureter where there is a heterogeneously enhancing right adnexal mass compressing over the right ureter. No left hydroureteronephrosis. No nephroureterolithiasis on either side. Urinary  bladder is decompressed and pushed anteriorly. Not well evaluated due to underdistention. Stomach/Bowel: There is a tiny sliding  hiatal hernia. No disproportionate dilation of the small or large bowel loops. No evidence of abnormal bowel wall thickening or inflammatory changes. The appendix is unremarkable. Vascular/Lymphatic: There is moderate to large ascites. There are irregular hyperattenuating deposits along the peritoneal reflections especially in the dependent pelvis. There is also marked thickening/extensive omental caking, compatible with peritoneal carcinomatosis. There is heterogeneous soft tissue in the peripancreatic region concerning for lymphadenopathy. No aneurysmal dilation of the major abdominal arteries. Reproductive: Normal-size anteverted uterus. However, there is heterogeneous 3.1 x 5.1 cm hyperattenuating mass in the right adnexa. Normal right ovary is not distinctly seen. Findings favor tumor implants covering the right ovary. Left ovary is not distinctly visualized. Other: There is a tiny fat containing umbilical hernia. The soft tissues and abdominal wall are otherwise unremarkable. Musculoskeletal: No suspicious osseous lesions. There are mild multilevel degenerative changes in the visualized spine. IMPRESSION: 1. There is a heterogeneous 3.1 x 5.1 cm hyperattenuating mass in the right adnexa. Normal right ovary is not distinctly seen. Findings favor primary ovarian malignancy versus tumor implants covering the right ovary. 2. There is moderate to large ascites with irregular hyperattenuating deposits along the peritoneal reflections especially in the dependent pelvis. There is also marked thickening/extensive omental caking, compatible with peritoneal carcinomatosis. 3. There is moderate right hydronephrosis and hydroureter up to the level of right lower ureter where there is a heterogeneously enhancing right adnexal mass, most likely compressing right lower ureter. No left  hydroureteronephrosis. No nephroureterolithiasis on either side. 4. Multiple other nonacute observations, as described above. Electronically Signed   By: Ree Molt M.D.   On: 07/05/2024 14:56

## 2024-07-16 NOTE — Procedures (Signed)
 Interventional Radiology Procedure:   Indications: Metastatic gynecologic cancer   Procedure: Port placement  Findings: Right jugular port, tip at SVC/RA junction.    Complications: None     EBL: Minimal, less than 10 ml  Plan: Discharge in one hour.  Keep port site and incisions dry for at least 24 hours.     Jaylei Fuerte R. Philip, MD  Pager: [phone]

## 2024-07-16 NOTE — Assessment & Plan Note (Signed)
 She has persistent ascites on exam She is mildly symptomatic I will tentatively schedule her for repeat paracentesis on Friday but we will call her in the morning to see if she wants to proceed on not

## 2024-07-16 NOTE — Discharge Instructions (Signed)
 SABRA

## 2024-07-16 NOTE — Assessment & Plan Note (Signed)
 We discussed the importance of monitoring for regular bowel movement

## 2024-07-16 NOTE — Assessment & Plan Note (Signed)
 The patient was diagnosed with what looks like stage IIIc right ovarian cancer after presentation with lower back pain in October.  The mass is causing right sided hydronephrosis.  She presented with abnormal imaging study and trace ascites.  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 The intent is of curative intent.  We discussed some of the risks, benefits, side-effects of carboplatin & Taxol. Treatment is intravenous, every 3 weeks x 6 cycles  Some of the short term side-effects included, though not limited to, including weight loss, life threatening infections, risk of allergic reactions, need for transfusions of blood products, nausea, vomiting, change in bowel habits, loss of hair, admission to hospital for various reasons, and risks of death.   Long term side-effects are also discussed including risks of infertility, permanent damage to nerve function, hearing loss, chronic fatigue, kidney damage with possibility needing hemodialysis, and rare secondary malignancy including bone marrow disorders.  The patient is aware that the response rates discussed earlier is not guaranteed.  After a long discussion, patient made an informed decision to proceed with the prescribed plan of care.   Patient education material was dispensed. We discussed premedication with dexamethasone  before chemotherapy. I recommend 3 cycles of treatment followed by repeat imaging study and possible debulking surgery  She had successful port placement today I will order labs today She will undergo chemo education class tomorrow She is scheduled for first dose of chemotherapy next week I  will see her the following week for supportive care and further evaluation to see whether she needs further paracentesis

## 2024-07-16 NOTE — Progress Notes (Addendum)
 Discharge instructions reviewed with patient and spouse at bedside. Denies questions concerns. Incision site remains clean dry and intact. No s/s of complications. PT escorted from the unit via wheel chair to personal vehicle.

## 2024-07-16 NOTE — Assessment & Plan Note (Signed)
Renal function is stable Observe closely  

## 2024-07-16 NOTE — H&P (Signed)
 "    Chief Complaint: ovarian cancer  Referring Provider(s): Viktoria Comer SAUNDERS, MD  Supervising Physician: Philip Cornet  Patient Status: [facility] - Out-pt  History of Present Illness: Karen Gilmore is a 68 y.o. female who has been recently diagnosed with ovarian cancer with metastasis. When the patient and her husband were on a trip at [facility] in October, she reports not feeling well and ended up going to the emergency department for right lower back pain. States she was diagnosed with a urinary tract infection and received pain medication and antibiotics for treatment. When she returned home from her trip, she had a cystoscopy with right ureteral stent placement on 11/19. After this procedure, she experienced ongoing back pain and reported abdominal distention. Patient went to the emergency department at [facility] on 12/19 and CT abdomen and pelvis revealed a 3.1 x 5.1 cm right adnexal mass and ascites. A paracentesis was performed and 4 liters was drawn; cytology from this fluid came back positive for malignancy. Received request for image guided port a catheter placement.   Confirms NPO since midnight and ride/supervision available for 24 hours. She does not wear CPAP or use supplemental home O2.   Denies fever, chills, shortness of breath, chest pain, sore throat, nausea, vomiting, diarrhea, blood in stool or urine, abnormal bruising, leg swelling.  Reports mild intermittent back pain, decreased appetite, weight loss of 5-6 pounds lighter than what I usually am.  Allergies Reviewed:  Chlorhexidine  gluconate   Patient is Full Code  Past Medical History:  Diagnosis Date   Allergy 2018   Chlorhexidine  - discovered when used to clean before donating blood   Constipation    Rectal bleeding    Rectal pain     Past Surgical History:  Procedure Laterality Date   COLONOSCOPY  07/18/2004   hemorrhoids;[facility]   CYSTOSCOPY W/ RETROGRADES Right 06/05/2024   Procedure: CYSTOSCOPY,  WITH RETROGRADE PYELOGRAM;  Surgeon: Carolee Sherwood JONETTA DOUGLAS, MD;  Location: [facility] OR[facility];  Service: Urology;  Laterality: Right;   CYSTOSCOPY/URETEROSCOPY/HOLMIUM LASER/STENT PLACEMENT Right 06/05/2024   Procedure: CYSTOSCOPY/URETEROSCOPY/BALLOON DILATION/STENT PLACEMENT;  Surgeon: Carolee Sherwood JONETTA DOUGLAS, MD;  Location: [facility];  Service: Urology;  Laterality: Right;   DILATION AND CURETTAGE OF UTERUS  07/18/1993   G 3 P 2     IR PARACENTESIS  07/05/2024   TUBAL LIGATION     Dr Charlie Aho      Medications: Prior to Admission medications  Medication Sig Start Date End Date Taking? Authorizing Provider  fluticasone  (FLONASE ) 50 MCG/ACT nasal spray Place 2 sprays into both nostrils daily. 07/27/23  Yes Antonio Cyndee Rockers R, DO  HYDROcodone -acetaminophen  (NORCO/VICODIN) 5-325 MG tablet Take 1 tablet by mouth every 6 (six) hours as needed. 06/05/24  Yes Carolee Sherwood JONETTA DOUGLAS, MD  Multiple Vitamin (MULTIVITAMIN) tablet Take 1 tablet by mouth daily.   Yes [provider]  Omega-3 Fatty Acids (FISH OIL) 1000 MG CAPS Take 1,000 mg by mouth daily.   Yes [provider]  ondansetron  (ZOFRAN ) 4 MG tablet Take 1 tablet (4 mg total) by mouth every 6 (six) hours. 07/05/24   Kingsley, Victoria K, DO  oxyCODONE -acetaminophen  (PERCOCET/ROXICET) 5-325 MG tablet Take 1 tablet by mouth every 6 (six) hours as needed for severe pain (pain score 7-10). 07/05/24   Dasie Faden, MD     Family History  Problem Relation Age of Onset   Breast cancer Mother 63   Heart attack Father 48   Heart disease Father  MI at age 61   Hyperlipidemia Father    Factor V Leiden deficiency Sister    Heart attack Paternal Grandfather        early 52s (NOT definite)   Miscarriages / Stillbirths Daughter    Factor V Leiden deficiency Data Processing Manager V Leiden deficiency Niece    Factor V Leiden deficiency Niece    Pancreatic cancer Maternal Great-grandmother    Bladder Cancer Maternal Aunt    Diabetes Neg Hx     Hypertension Neg Hx    Stroke Neg Hx    Colon cancer Neg Hx    Colon polyps Neg Hx     Social History   Socioeconomic History   Marital status: Media Planner    Spouse name: Not on file   Number of children: Not on file   Years of education: Not on file   Highest education level: Master's degree (e.g., MA, MS, MEng, MEd, MSW, MBA)  Occupational History   Occupation: runner, broadcasting/film/video    Comment: retired 2023  Tobacco Use   Smoking status: Never    Passive exposure: Never   Smokeless tobacco: Never  Vaping Use   Vaping status: Never Used  Substance and Sexual Activity   Alcohol use: Yes    Alcohol/week: 3.0 standard drinks of alcohol    Types: 3 Glasses of wine per week    Comment: social   Drug use: No   Sexual activity: Not Currently  Other Topics Concern   Not on file  Social History Narrative   Exercise- Walk, body pump, tennis-  does something everyday   Social Drivers of Health   Tobacco Use: Low Risk (07/08/2024)   Patient History    Smoking Tobacco Use: Never    Smokeless Tobacco Use: Never    Passive Exposure: Never  Financial Resource Strain: Low Risk (03/04/2024)   Overall Financial Resource Strain (CARDIA)    Difficulty of Paying Living Expenses: Not very hard  Food Insecurity: No Food Insecurity (07/15/2024)   Epic    Worried About Radiation Protection Practitioner of Food in the Last Year: Never true    Ran Out of Food in the Last Year: Never true  Transportation Needs: No Transportation Needs (07/15/2024)   Epic    Lack of Transportation (Medical): No    Lack of Transportation (Non-Medical): No  Physical Activity: Sufficiently Active (03/04/2024)   Exercise Vital Sign    Days of Exercise per Week: 6 days    Minutes of Exercise per Session: 60 min  Stress: No Stress Concern Present (03/04/2024)   [employer] of Occupational Health - Occupational Stress Questionnaire    Feeling of Stress: Not at all  Social Connections: Moderately Integrated (03/04/2024)   Social  Connection and Isolation Panel    Frequency of Communication with Friends and Family: More than three times a week    Frequency of Social Gatherings with Friends and Family: More than three times a week    Attends Religious Services: More than 4 times per year    Active Member of Clubs or Organizations: Yes    Attends Banker Meetings: More than 4 times per year    Marital Status: Never married  Depression (PHQ2-9): Low Risk (03/04/2024)   Depression (PHQ2-9)    PHQ-2 Score: 0  Alcohol Screen: Low Risk (03/04/2024)   Alcohol Screen    Last Alcohol Screening Score (AUDIT): 3  Housing: Low Risk (07/15/2024)   Epic    Unable to Pay for Housing  in the Last Year: No    Number of Times Moved in the Last Year: 0    Homeless in the Last Year: No  Utilities: Not At Risk (07/15/2024)   Epic    Threatened with loss of utilities: No  Health Literacy: Adequate Health Literacy (03/04/2024)   B1300 Health Literacy    Frequency of need for help with medical instructions: Never     Review of Systems: A 12 point ROS discussed and pertinent positives are indicated in the HPI above.  All other systems are negative.    Vital Signs: BP (!) 148/80   Pulse 73   Temp (!) 97.4 F (36.3 C) (Oral)   Resp 17   Ht 5' 4 (1.626 m)   Wt 127 lb (57.6 kg)   SpO2 96%   BMI 21.80 kg/m   Advance Care Plan: no documents on file   Physical Exam HENT:     Mouth/Throat:     Mouth: Mucous membranes are moist.  Cardiovascular:     Rate and Rhythm: Normal rate and regular rhythm.  Pulmonary:     Effort: Pulmonary effort is normal. No respiratory distress.     Breath sounds: Normal breath sounds.  Abdominal:     General: Bowel sounds are normal.     Palpations: Abdomen is soft.     Tenderness: There is no abdominal tenderness.  Skin:    General: Skin is warm and dry.  Neurological:     Mental Status: She is alert and oriented to person, place, and time.  Psychiatric:        Mood and  Affect: Mood normal.        Behavior: Behavior normal.     Imaging: IR Paracentesis Result Date: 07/05/2024 INDICATION: Patient with history of abdominal pain/distention, recent imaging revealing right adnexal mass, ascites, omental caking, right hydronephrosis. Request received for diagnostic and therapeutic paracentesis. EXAM: ULTRASOUND GUIDED DIAGNOSTIC AND THERAPEUTIC PARACENTESIS MEDICATIONS: 8 mL 1% lidocaine  with epinephrine  to skin/subcutaneous tissue COMPLICATIONS: None immediate. PROCEDURE: Informed written consent was obtained from the patient after a discussion of the risks, benefits and alternatives to treatment. A timeout was performed prior to the initiation of the procedure. Initial ultrasound scanning demonstrates a moderate to large amount of ascites within the right mid to lower abdominal quadrant. The right mid to lower abdomen was prepped and draped in the usual sterile fashion. 1% lidocaine  with epinephrine  was used for local anesthesia. Following this, a 6 Fr Safe-T-Centesis catheter was introduced. An ultrasound image was saved for documentation purposes. The paracentesis was performed. The catheter was removed and a dressing was applied. The patient tolerated the procedure well without immediate post procedural complication. FINDINGS: A total of approximately 4 liters of slightly hazy, yellow fluid was removed. Samples were sent to the laboratory as requested by the clinical team. IMPRESSION: Successful ultrasound-guided diagnostic and therapeutic paracentesis yielding 4 liters of peritoneal fluid. Performed by: Franky Rakers, PA-C Electronically Signed   By: Juliene Balder M.D.   On: 07/05/2024 17:46   CT ABDOMEN PELVIS W CONTRAST Result Date: 07/05/2024 CLINICAL DATA:  Abdominal pain, acute, nonlocalized. * Tracking Code: BO * EXAM: CT ABDOMEN AND PELVIS WITH CONTRAST TECHNIQUE: Multidetector CT imaging of the abdomen and pelvis was performed using the standard protocol following  bolus administration of intravenous contrast. RADIATION DOSE REDUCTION: This exam was performed according to the departmental dose-optimization program which includes automated exposure control, adjustment of the mA and/or kV according to patient size and/or use  of iterative reconstruction technique. CONTRAST:  80mL OMNIPAQUE  IOHEXOL  300 MG/ML  SOLN COMPARISON:  None Available. FINDINGS: Lower chest: The lung bases are clear. No pleural effusion. The heart is normal in size. No pericardial effusion. Hepatobiliary: The liver is normal in size. Non-cirrhotic configuration. No suspicious mass. These is mild diffuse hepatic steatosis. No intrahepatic or extrahepatic bile duct dilation. No calcified gallstones. Normal gallbladder wall thickness. No pericholecystic inflammatory changes. Pancreas: Unremarkable. No pancreatic ductal dilatation or surrounding inflammatory changes. Spleen: Within normal limits. No focal lesion. Adrenals/Urinary Tract: Adrenal glands are unremarkable. No suspicious renal mass. There is moderate right hydronephrosis and hydroureter up to the level of right lower ureter where there is a heterogeneously enhancing right adnexal mass compressing over the right ureter. No left hydroureteronephrosis. No nephroureterolithiasis on either side. Urinary bladder is decompressed and pushed anteriorly. Not well evaluated due to underdistention. Stomach/Bowel: There is a tiny sliding hiatal hernia. No disproportionate dilation of the small or large bowel loops. No evidence of abnormal bowel wall thickening or inflammatory changes. The appendix is unremarkable. Vascular/Lymphatic: There is moderate to large ascites. There are irregular hyperattenuating deposits along the peritoneal reflections especially in the dependent pelvis. There is also marked thickening/extensive omental caking, compatible with peritoneal carcinomatosis. There is heterogeneous soft tissue in the peripancreatic region concerning for  lymphadenopathy. No aneurysmal dilation of the major abdominal arteries. Reproductive: Normal-size anteverted uterus. However, there is heterogeneous 3.1 x 5.1 cm hyperattenuating mass in the right adnexa. Normal right ovary is not distinctly seen. Findings favor tumor implants covering the right ovary. Left ovary is not distinctly visualized. Other: There is a tiny fat containing umbilical hernia. The soft tissues and abdominal wall are otherwise unremarkable. Musculoskeletal: No suspicious osseous lesions. There are mild multilevel degenerative changes in the visualized spine. IMPRESSION: 1. There is a heterogeneous 3.1 x 5.1 cm hyperattenuating mass in the right adnexa. Normal right ovary is not distinctly seen. Findings favor primary ovarian malignancy versus tumor implants covering the right ovary. 2. There is moderate to large ascites with irregular hyperattenuating deposits along the peritoneal reflections especially in the dependent pelvis. There is also marked thickening/extensive omental caking, compatible with peritoneal carcinomatosis. 3. There is moderate right hydronephrosis and hydroureter up to the level of right lower ureter where there is a heterogeneously enhancing right adnexal mass, most likely compressing right lower ureter. No left hydroureteronephrosis. No nephroureterolithiasis on either side. 4. Multiple other nonacute observations, as described above. Electronically Signed   By: Ree Molt M.D.   On: 07/05/2024 14:56    Labs:  CBC: Recent Labs    07/27/23 1007 05/28/24 1444 07/05/24 1123  WBC 5.9 6.6 8.2  HGB 14.1 13.1 13.4  HCT 42.3 40.4 41.6  PLT 274.0 282 410*    COAGS: No results for input(s): INR, APTT in the last 8760 hours.  BMP: Recent Labs    07/27/23 1007 05/28/24 1444 07/05/24 1123  NA 137 140 138  K 4.7 4.4 4.7  CL 100 103 102  CO2 29 28 26   GLUCOSE 84 94 98  BUN 18 16 13   CALCIUM 9.5 9.6 9.3  CREATININE 0.78 0.76 0.72  GFRNONAA  --   >60 >60    LIVER FUNCTION TESTS: Recent Labs    07/27/23 1007 07/05/24 1123  BILITOT 0.5 0.3  AST 21 99*  ALT 16 67*  ALKPHOS 97 105  PROT 7.0 7.0  ALBUMIN 4.5 3.8    TUMOR MARKERS: No results for input(s): AFPTM, CEA, CA199, CHROMGRNA in  the last 8760 hours.  Assessment and Plan: Ovarian cancer-  Request for image guided port a catheter placement. No contraindications for procedure identified in ROS, physical exam, or review of pre-sedation considerations.  Labs reviewed and within acceptable range Imaging available and reviewed VSS, afebrile   Risks and benefits of image guided port-a-catheter placement was discussed with the patient including, but not limited to bleeding, infection, pneumothorax, or fibrin sheath development and need for additional procedures.  All of the patient's questions were answered, patient is agreeable to proceed. Consent signed and in chart.   Thank you for allowing our service to participate in Karen Gilmore 's care.    Electronically Signed: Sidra Oldfield B [place] Gustafson, NP   07/16/2024, 10:44 AM     I spent a total of 15 Minutes in face to face in clinical consultation, greater than 50% of which was counseling/coordinating care for image guided port a catheter placement.   (A copy of this note was sent to the referring provider and the time of visit.)  "

## 2024-07-17 ENCOUNTER — Encounter: Payer: Self-pay | Admitting: Hematology and Oncology

## 2024-07-17 ENCOUNTER — Inpatient Hospital Stay

## 2024-07-17 LAB — CA 125: Cancer Antigen (CA) 125: 5560 U/mL — ABNORMAL HIGH (ref 0.0–38.1)

## 2024-07-19 ENCOUNTER — Encounter: Payer: Self-pay | Admitting: Family Medicine

## 2024-07-19 ENCOUNTER — Telehealth: Payer: Self-pay

## 2024-07-19 ENCOUNTER — Ambulatory Visit (HOSPITAL_COMMUNITY)

## 2024-07-19 NOTE — Telephone Encounter (Signed)
 Called her to see if she still needs paracentesis today. She requested to cancel today's appt. Appt canceled.

## 2024-07-22 ENCOUNTER — Inpatient Hospital Stay: Attending: Gynecologic Oncology

## 2024-07-22 VITALS — BP 131/69 | HR 72 | Temp 97.7°F | Resp 14 | Ht 64.0 in | Wt 131.5 lb

## 2024-07-22 DIAGNOSIS — T451X5A Adverse effect of antineoplastic and immunosuppressive drugs, initial encounter: Secondary | ICD-10-CM | POA: Diagnosis not present

## 2024-07-22 DIAGNOSIS — K5900 Constipation, unspecified: Secondary | ICD-10-CM | POA: Insufficient documentation

## 2024-07-22 DIAGNOSIS — C561 Malignant neoplasm of right ovary: Secondary | ICD-10-CM | POA: Diagnosis not present

## 2024-07-22 DIAGNOSIS — N1330 Unspecified hydronephrosis: Secondary | ICD-10-CM | POA: Diagnosis not present

## 2024-07-22 DIAGNOSIS — R180 Malignant ascites: Secondary | ICD-10-CM | POA: Diagnosis not present

## 2024-07-22 DIAGNOSIS — Z5111 Encounter for antineoplastic chemotherapy: Secondary | ICD-10-CM | POA: Insufficient documentation

## 2024-07-22 DIAGNOSIS — D6481 Anemia due to antineoplastic chemotherapy: Secondary | ICD-10-CM | POA: Diagnosis not present

## 2024-07-22 MED ORDER — FAMOTIDINE IN NACL 20-0.9 MG/50ML-% IV SOLN
20.0000 mg | Freq: Once | INTRAVENOUS | Status: AC
  Administered 2024-07-22: 20 mg via INTRAVENOUS
  Filled 2024-07-22: qty 50

## 2024-07-22 MED ORDER — SODIUM CHLORIDE 0.9 % IV SOLN: INTRAVENOUS | Status: DC

## 2024-07-22 MED ORDER — PALONOSETRON HCL INJECTION 0.25 MG/5ML
0.2500 mg | Freq: Once | INTRAVENOUS | Status: AC
  Administered 2024-07-22: 0.25 mg via INTRAVENOUS
  Filled 2024-07-22: qty 5

## 2024-07-22 MED ORDER — DEXAMETHASONE SOD PHOSPHATE PF 10 MG/ML IJ SOLN
10.0000 mg | Freq: Once | INTRAMUSCULAR | Status: AC
  Administered 2024-07-22: 10 mg via INTRAVENOUS

## 2024-07-22 MED ORDER — DIPHENHYDRAMINE HCL 50 MG/ML IJ SOLN
12.5000 mg | Freq: Once | INTRAMUSCULAR | Status: AC
  Administered 2024-07-22: 12.5 mg via INTRAVENOUS
  Filled 2024-07-22: qty 1

## 2024-07-22 MED ORDER — APREPITANT 130 MG/18ML IV EMUL
130.0000 mg | Freq: Once | INTRAVENOUS | Status: AC
  Administered 2024-07-22: 130 mg via INTRAVENOUS
  Filled 2024-07-22: qty 18

## 2024-07-22 MED ORDER — SODIUM CHLORIDE 0.9 % IV SOLN
175.0000 mg/m2 | Freq: Once | INTRAVENOUS | Status: AC
  Administered 2024-07-22: 288 mg via INTRAVENOUS
  Filled 2024-07-22: qty 48

## 2024-07-22 MED ORDER — SODIUM CHLORIDE 0.9 % IV SOLN
433.8000 mg | Freq: Once | INTRAVENOUS | Status: AC
  Administered 2024-07-22: 430 mg via INTRAVENOUS
  Filled 2024-07-22: qty 43

## 2024-07-22 NOTE — Patient Instructions (Signed)
 [facility] - A DEPT OF [facility]  Discharge Instructions: Thank you for choosing [facility] to provide your oncology and hematology care.   If you have a lab appointment with the Cancer Center, please go directly to the Cancer Center and check in at the registration area.   Wear comfortable clothing and clothing appropriate for easy access to any Portacath or PICC line.   We strive to give you quality time with your provider. You may need to reschedule your appointment if you arrive late (15 or more minutes).  Arriving late affects you and other patients whose appointments are after yours.  Also, if you miss three or more appointments without notifying the office, you may be dismissed from the clinic at the provider's discretion.      For prescription refill requests, have your pharmacy contact our office and allow 72 hours for refills to be completed.    Today you received the following chemotherapy and/or immunotherapy agents: Paclitaxel  (Taxol ) & Carboplatin  (Paraplatin )      To help prevent nausea and vomiting after your treatment, we encourage you to take your nausea medication as directed.  BELOW ARE SYMPTOMS THAT SHOULD BE REPORTED IMMEDIATELY: *FEVER GREATER THAN 100.4 F (38 C) OR HIGHER *CHILLS OR SWEATING *NAUSEA AND VOMITING THAT IS NOT CONTROLLED WITH YOUR NAUSEA MEDICATION *UNUSUAL SHORTNESS OF BREATH *UNUSUAL BRUISING OR BLEEDING *URINARY PROBLEMS (pain or burning when urinating, or frequent urination) *BOWEL PROBLEMS (unusual diarrhea, constipation, pain near the anus) TENDERNESS IN MOUTH AND THROAT WITH OR WITHOUT PRESENCE OF ULCERS (sore throat, sores in mouth, or a toothache) UNUSUAL RASH, SWELLING OR PAIN  UNUSUAL VAGINAL DISCHARGE OR ITCHING   Items with * indicate a potential emergency and should be followed up as soon as possible or go to the Emergency Department if any problems should occur.  Please show the  CHEMOTHERAPY ALERT CARD or IMMUNOTHERAPY ALERT CARD at check-in to the Emergency Department and triage nurse.  Should you have questions after your visit or need to cancel or reschedule your appointment, please contact [facility] - A DEPT OF [facility]  Dept: [phone]  and follow the prompts.  Office hours are 8:00 a.m. to 4:30 p.m. Monday - Friday. Please note that voicemails left after 4:00 p.m. may not be returned until the following business day.  We are closed weekends and major holidays. You have access to a nurse at all times for urgent questions. Please call the main number to the clinic Dept: [phone] and follow the prompts.   For any non-urgent questions, you may also contact your provider using MyChart. We now offer e-Visits for anyone 41 and older to request care online for non-urgent symptoms. For details visit [email or web address].   Also download the MyChart app! Go to the app store, search MyChart, open the app, select [facility], and log in with your MyChart username and password.

## 2024-07-23 ENCOUNTER — Ambulatory Visit: Payer: Self-pay | Admitting: Gynecologic Oncology

## 2024-07-23 ENCOUNTER — Telehealth: Payer: Self-pay

## 2024-07-23 NOTE — Telephone Encounter (Signed)
 Zada Parsley contacted me today to share that she had not yet received her out-of-pocket estimate from [facility] via text. I reached out to [facility] after speaking to Mellen and they informed me that her OOP cost estimate had not yet been sent.   I called Gennett back and left a message letter her know the estimate is still outstanding.   Santana Fryer, MS, CGC  Certified Genetic Counselor  Email: [email or web address]  Phone: [phone]

## 2024-07-23 NOTE — Telephone Encounter (Signed)
LM for patient that this nurse was calling to see how they were doing after their treatment. Please call back to Dr. Gorsuch's nurse at [phone] if they have any questions or concerns regarding the treatment. 

## 2024-07-23 NOTE — Telephone Encounter (Signed)
-----   Message from Nurse Elenor ORN, RN sent at 07/22/2024  3:17 PM EST ----- Regarding: Dr. Lonn 1st time Taxol Dennice f/u tol well Dr. Lonn 1st time Taxol Dennice, Pt tolerated tx without incident. Pt callback due.

## 2024-07-25 ENCOUNTER — Telehealth: Payer: Self-pay

## 2024-07-25 NOTE — Telephone Encounter (Signed)
 Returned her call. She is complaining of constipation, last bm Monday. She is taking miralax daily. Instructed to take Miralax BID and start senokot-s 2 tabs TID today. She will call the office back in the am with update.

## 2024-07-26 ENCOUNTER — Telehealth: Payer: Self-pay | Admitting: Nutrition

## 2024-07-26 ENCOUNTER — Telehealth: Payer: Self-pay

## 2024-07-26 LAB — GLUCOSE, PLEURAL OR PERITONEAL FLUID

## 2024-07-26 LAB — PROTEIN, PLEURAL OR PERITONEAL FLUID

## 2024-07-26 NOTE — Telephone Encounter (Signed)
 Patient has been scheduled for telephone visit for her first consultation with nutritionist. Aware of appt date and time.  Hoping she can schedule fu appts in person thereafter.

## 2024-07-26 NOTE — Telephone Encounter (Signed)
 She called back to follow up on phone call yesterday and no BM since Monday. She increased the Miralax to BID and started senokot-S 2 tabs TID yesterday. She had bm this am and will continue laxatives until constipation has resolved. She is passing gas and her abdomen is rumbling. She does not feel that she needs more laxatives. She will continue with increasing water intake.  She will call the office back Monday with update.  FYI- she wanted you to be aware.

## 2024-07-26 NOTE — Telephone Encounter (Signed)
 Contacted pt to schedule an appt for nutrition consult. Unable to reach via phone, voicemail was left.

## 2024-07-29 ENCOUNTER — Inpatient Hospital Stay

## 2024-07-29 ENCOUNTER — Inpatient Hospital Stay: Admitting: Hematology and Oncology

## 2024-07-29 ENCOUNTER — Encounter: Payer: Medicare Other | Admitting: Family Medicine

## 2024-07-29 VITALS — BP 143/87 | HR 77 | Temp 99.6°F | Resp 18 | Ht 64.0 in | Wt 125.4 lb

## 2024-07-29 DIAGNOSIS — C561 Malignant neoplasm of right ovary: Secondary | ICD-10-CM | POA: Diagnosis not present

## 2024-07-29 DIAGNOSIS — Z5111 Encounter for antineoplastic chemotherapy: Secondary | ICD-10-CM | POA: Diagnosis not present

## 2024-07-29 NOTE — Progress Notes (Signed)
 Nutrition Assessment   Reason for Assessment:  Patient request  ASSESSMENT:  69 year old female with stage IIIc right ovarian cancer. Mass causing right sided hydronephrosis.  No PMHx.  Patient started carboplatin /taxol .  Spoke with patient via phone for nutrition assessment.  Reports that her appetite was good for 3 days after treatment then on 4th day felt bad and appetite decreased.  On the fifth day daughter was with her and put food in front of her so she ate better.  Reports overall decreased appetite, weight loss.  Has had a Wyona recently that tasted good and chicken sandwich.  Has tried a variety of protein shakes and does not really like them.      Nutrition Focused Physical Exam:   Unable to perform   Medications: compazine , zofran , MVI, omega 3, dexamethasone    Labs: creatine 1.08   Anthropometrics:   Height: 64 inches Weight: 131 lb 8 oz on 1/5 129 lb on 12/30 132 lb on 12/22 136 lb on 12/19 139 lb on 01/09/24 BMI: 22  6% weight loss in the last 6 months   Estimated Energy Needs  Kcals: 1500-1800 Protein: 72-90 g Fluid: >1500-1834ml   NUTRITION DIAGNOSIS: Inadequate oral intake related to cancer and related treatment side effects as evidenced by 6% weight loss in the last 6 months, reduced oral intake.    INTERVENTION:  Encouraged protein source at every meal (ie peanut butter and fruit, cottage cheese and fruit, etc) Encouraged taking nausea medication to help reduce that symptom.   Encouraged bowel regiment to help ease constipation When appetite is poor, liberalize diet (eat what you are able to eat to maintain calories/protein and weight) Contact information provided    MONITORING, EVALUATION, GOAL: weight trends, intake   Next Visit: Monday, Jan 26 during infusion  Fanchon Papania B. Dasie SOLON, CSO, LDN Registered Dietitian [phone]

## 2024-07-30 ENCOUNTER — Telehealth: Payer: Self-pay

## 2024-07-30 ENCOUNTER — Encounter: Payer: Self-pay | Admitting: Hematology and Oncology

## 2024-07-30 ENCOUNTER — Ambulatory Visit: Payer: Self-pay

## 2024-07-30 DIAGNOSIS — Z1379 Encounter for other screening for genetic and chromosomal anomalies: Secondary | ICD-10-CM | POA: Insufficient documentation

## 2024-07-30 NOTE — Assessment & Plan Note (Addendum)
 The patient was diagnosed with what looks like stage IIIc right ovarian cancer after presentation with lower back pain in October.  The mass is causing right sided hydronephrosis.  She presented with abnormal imaging study and trace ascites.  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

## 2024-07-30 NOTE — Progress Notes (Signed)
 HPI:  Karen Gilmore was previously seen in the [facility] clinic due to a personal history of ovarian and concerns regarding a hereditary predisposition to cancer. Please refer to our prior cancer genetics clinic note for more information regarding our discussion, assessment and recommendations, at the time. Karen Gilmore's recent genetic test results were disclosed to her, as were recommendations warranted by these results. These results and recommendations are discussed in more detail below.  Karen Gilmore results were disclosed by phone on 07/30/2024.   CANCER HISTORY:  Oncology History  Ovarian cancer on right [facility])  05/10/2024 Initial Diagnosis   She presented to emergency department in [place]  with lower back pain.  CT imaging showed evidence of right hydronephrosis and nonspecific ascites   06/05/2024 Pathology Results   CYTOLOGY - NON PAP  CASE: [id number]  PATIENT: Karen Gilmore  Non-Gynecological Cytology Report   Clinical History: H/o abdominal pain/ distention, right adnexal mass,  ascites.  Specimen Submitted:  A. ASCITES, PARACENTESIS:   FINAL MICROSCOPIC DIAGNOSIS:  - Adenocarcinoma  - Favor gynecologic primary   SPECIMEN ADEQUACY:  Satisfactory for evaluation   DIAGNOSTIC COMMENTS:  The specimen contains cellular glandular proliferation formal small glands with cytoplasmic vacuoles. Immunohistochemical stains were performed to characterize the cells. The cells are positive for PAX8 and WT1, and are negative for Napsin A. P53 is overexpressed in some cells but overall appears to be wild type pattern of staining. 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     06/05/2024 Surgery   Operative Note   Preoperative diagnosis:  1.  Right hydronephrosis   Postoperative diagnosis: 1.  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  Cystoscopy with right retrograde pyelogram, right diagnostic  ureteroscopy, right ureteral balloon dilation, right ureteral stent placement   Surgeon: Sherwood Edison, MD    Complications: None immediate  Drains/Catheters: 1.  6 x 24 double-J ureteral stent   Intraoperative findings: 1.  Normal urethra bladder    2.  Right retrograde pyelogram revealed hydroureteronephrosis down to the mid to distal ureter where there was a clear transition point.  She also had significant tortuosity of the proximal ureter/ureteropelvic junction but there was no tumor, stone, or stricture proximally   3.  Diagnostic ureteroscopy confirmed presence of a mild circumferential ureteral stricture that was dilated to 15 French with balloon dilator for 5 minutes.     07/05/2024 Imaging   IR Paracentesis Result Date: 07/05/2024 INDICATION: Patient with history of abdominal pain/distention, recent imaging revealing right adnexal mass, ascites, omental caking, right hydronephrosis. Request received for diagnostic and therapeutic paracentesis. EXAM: ULTRASOUND GUIDED DIAGNOSTIC AND THERAPEUTIC PARACENTESIS MEDICATIONS: 8 mL 1% lidocaine  with epinephrine  to skin/subcutaneous tissue COMPLICATIONS: None immediate. PROCEDURE: Informed written consent was obtained from the patient after a discussion of the risks, benefits and alternatives to treatment. A timeout was performed prior to the initiation of the procedure. Initial ultrasound scanning demonstrates a moderate to large amount of ascites within the right mid to lower abdominal quadrant. The right mid to lower abdomen was prepped and draped in the usual sterile fashion. 1% lidocaine  with epinephrine  was used for local anesthesia. Following this, a 6 Fr Safe-T-Centesis catheter was introduced. An ultrasound image was saved for documentation purposes. The paracentesis was performed. The catheter was removed and a dressing was applied. The patient tolerated the procedure well without immediate post procedural complication. FINDINGS: A total  of approximately 4 liters of slightly hazy, yellow fluid was removed.  Samples were sent to the laboratory as requested by the clinical team. IMPRESSION: Successful ultrasound-guided diagnostic and therapeutic paracentesis yielding 4 liters of peritoneal fluid. Performed by: Franky Rakers, PA-C Electronically Signed   By: Juliene Balder M.D.   On: 07/05/2024 17:46   CT ABDOMEN PELVIS W CONTRAST Result Date: 07/05/2024 CLINICAL DATA:  Abdominal pain, acute, nonlocalized. * Tracking Code: BO * EXAM: CT ABDOMEN AND PELVIS WITH CONTRAST TECHNIQUE: Multidetector CT imaging of the abdomen and pelvis was performed using the standard protocol following bolus administration of intravenous contrast. RADIATION DOSE REDUCTION: This exam was performed according to the departmental dose-optimization program which includes automated exposure control, adjustment of the mA and/or kV according to patient size and/or use of iterative reconstruction technique. CONTRAST:  80mL OMNIPAQUE  IOHEXOL  300 MG/ML  SOLN COMPARISON:  None Available. FINDINGS: Lower chest: The lung bases are clear. No pleural effusion. The heart is normal in size. No pericardial effusion. Hepatobiliary: The liver is normal in size. Non-cirrhotic configuration. No suspicious mass. These is mild diffuse hepatic steatosis. No intrahepatic or extrahepatic bile duct dilation. No calcified gallstones. Normal gallbladder wall thickness. No pericholecystic inflammatory changes. Pancreas: Unremarkable. No pancreatic ductal dilatation or surrounding inflammatory changes. Spleen: Within normal limits. No focal lesion. Adrenals/Urinary Tract: Adrenal glands are unremarkable. No suspicious renal mass. There is moderate right hydronephrosis and hydroureter up to the level of right lower ureter where there is a heterogeneously enhancing right adnexal mass compressing over the right ureter. No left hydroureteronephrosis. No nephroureterolithiasis on either side. Urinary bladder is  decompressed and pushed anteriorly. Not well evaluated due to underdistention. Stomach/Bowel: There is a tiny sliding hiatal hernia. No disproportionate dilation of the small or large bowel loops. No evidence of abnormal bowel wall thickening or inflammatory changes. The appendix is unremarkable. Vascular/Lymphatic: There is moderate to large ascites. There are irregular hyperattenuating deposits along the peritoneal reflections especially in the dependent pelvis. There is also marked thickening/extensive omental caking, compatible with peritoneal carcinomatosis. There is heterogeneous soft tissue in the peripancreatic region concerning for lymphadenopathy. No aneurysmal dilation of the major abdominal arteries. Reproductive: Normal-size anteverted uterus. However, there is heterogeneous 3.1 x 5.1 cm hyperattenuating mass in the right adnexa. Normal right ovary is not distinctly seen. Findings favor tumor implants covering the right ovary. Left ovary is not distinctly visualized. Other: There is a tiny fat containing umbilical hernia. The soft tissues and abdominal wall are otherwise unremarkable. Musculoskeletal: No suspicious osseous lesions. There are mild multilevel degenerative changes in the visualized spine. IMPRESSION: 1. There is a heterogeneous 3.1 x 5.1 cm hyperattenuating mass in the right adnexa. Normal right ovary is not distinctly seen. Findings favor primary ovarian malignancy versus tumor implants covering the right ovary. 2. There is moderate to large ascites with irregular hyperattenuating deposits along the peritoneal reflections especially in the dependent pelvis. There is also marked thickening/extensive omental caking, compatible with peritoneal carcinomatosis. 3. There is moderate right hydronephrosis and hydroureter up to the level of right lower ureter where there is a heterogeneously enhancing right adnexal mass, most likely compressing right lower ureter. No left hydroureteronephrosis. No  nephroureterolithiasis on either side. 4. Multiple other nonacute observations, as described above. Electronically Signed   By: Ree Molt M.D.   On: 07/05/2024 14:56      07/05/2024 Tumor Marker   Patient's tumor was tested for the following markers: CA-125. Results of the tumor marker test revealed 3753.   07/07/2024 Initial Diagnosis   Ovarian cancer on right (HCC)  07/16/2024 Cancer Staging   Staging form: Ovary, Fallopian Tube, and Primary Peritoneal Carcinoma, AJCC 8th Edition - Clinical stage from 07/16/2024: FIGO Stage IIIC (cT3c, cN0, cM0) - Signed by Lonn Hicks, MD on 07/16/2024 Stage prefix: Initial diagnosis   07/19/2024 Tumor Marker   Patient's tumor was tested for the following markers: CA-125. Results of the tumor marker test revealed 5560.   07/22/2024 -  Chemotherapy   Patient is on Treatment Plan : OVARIAN Carboplatin  (AUC 6) + Paclitaxel  (175) q21d X 6 Cycles      Genetic Testing   Negative genetic testing. No pathogenic variants identified on the [facility] CancerNext-Expanded+RNA Panel. 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) Genetic Testing Ordered: [facility] CancerNext-Expanded + RNAinsight gene panel which includes sequencing, rearrangement, and RNA analysis for the following 77 genes: AIP, ALK, APC, ATM, AXIN2, BAP1, BARD1, BMPR1A, BRCA1, BRCA2, BRIP1, CDC73, CDH1, CDK4, CDKN1B, CDKN2A, CEBPA, CHEK2, CTNNA1, DDX41, DICER1, ETV6, FH, FLCN, GATA2, LZTR1, MAX, MBD4, MEN1, MET, MLH1, MSH2, MSH3, MSH6, MUTYH, NF1, NF2, NTHL1, PALB2, PHOX2B, PMS2, POT1, PRKAR1A, PTCH1, PTEN, RAD51C, RAD51D, RB1, RET, RPS20, RUNX1, SDHA, SDHAF2, SDHB, SDHC, SDHD, SMAD4, SMARCA4, SMARCB1, SMARCE1, STK11, SUFU, TMEM127, TP53, TSC1, TSC2, VHL, and WT1 (sequencing and deletion/duplication); EGFR, HOXB13, KIT, MITF, PDGFRA, POLD1, and POLE (sequencing only); EPCAM and GREM1  (deletion/duplication only).     FAMILY HISTORY:  We obtained a detailed, 4-generation family history pasted below.   Karen Gilmore is unaware of relatives completing genetic testing for hereditary cancer risks.  There is no reported Ashkenazi Jewish ancestry.  There is no known consanguinity.   Pedigree Summary Mother - Breast Cancer dx. 102 Maternal Aunt - Bladder Cancer dx. 36  Maternal Great Grand mother - Pancreatic Cancer   Family History  Problem Relation Age of Onset   Breast cancer Mother 14   Heart attack Father 22   Heart disease Father        MI at age 73   Hyperlipidemia Father    Factor V Leiden deficiency Sister    Heart attack Paternal Grandfather        early 51s (NOT definite)   Miscarriages / Stillbirths Daughter    Factor V Leiden deficiency Data Processing Manager V Leiden deficiency Niece    Factor V Leiden deficiency Niece    Pancreatic cancer Maternal Great-grandmother    Bladder Cancer Maternal Aunt    Diabetes Neg Hx    Hypertension Neg Hx    Stroke Neg Hx    Colon cancer Neg Hx    Colon polyps Neg Hx    GENETIC TEST RESULTS: Genetic testing reported out on 07/25/2024 through the [employer] CancerNext-Expanded+RNAinsight panel found no pathogenic mutations.  [facility] CancerNext-Expanded + RNAinsight gene panel which includes sequencing, rearrangement, and RNA analysis for the following 77 genes: AIP, ALK, APC, ATM, AXIN2, BAP1, BARD1, BMPR1A, BRCA1, BRCA2, BRIP1, CDC73, CDH1, CDK4, CDKN1B, CDKN2A, CEBPA, CHEK2, CTNNA1, DDX41, DICER1, ETV6, FH, FLCN, GATA2, LZTR1, MAX, MBD4, MEN1, MET, MLH1, MSH2, MSH3, MSH6, MUTYH, NF1, NF2, NTHL1, PALB2, PHOX2B, PMS2, POT1, PRKAR1A, PTCH1, PTEN, RAD51C, RAD51D, RB1, RET, RPS20, RUNX1, SDHA, SDHAF2, SDHB, SDHC, SDHD, SMAD4, SMARCA4, SMARCB1, SMARCE1, STK11, SUFU, TMEM127, TP53, TSC1, TSC2, VHL, and WT1 (sequencing and deletion/duplication); EGFR, HOXB13, KIT, MITF, PDGFRA, POLD1, and POLE (sequencing only); EPCAM and GREM1  (deletion/duplication only).   The test report has been scanned into EPIC and is located under the Molecular  Pathology section of the Results Review tab.  A portion of the result report is included below for reference.    We discussed with Karen Gilmore that because current genetic testing is not perfect, it is possible there may be a gene mutation in one of these genes that current testing cannot detect, but that chance is small.  We also discussed, that there could be another gene that has not yet been discovered, or that we have not yet tested, that is responsible for the cancer diagnoses in the family. It is also possible there is a hereditary cause for the cancer in the family that Karen Gilmore did not inherit and therefore was not identified in her testing.  Therefore, it is important to remain in touch with cancer genetics in the future so that we can continue to offer Karen Gilmore the most up to date genetic testing.   Genetic testing did identify a variant of uncertain significance (VUS) was identified in the ALK gene. The specific variant is ALK, p.G385D (c.1154G>A). At this time, it is unknown if this variant is associated with increased cancer risk or if this is a normal finding, but most variants such as this get reclassified to being inconsequential. It should not be used to make medical management decisions. With time, we suspect the lab will determine the significance of this variant, if any. If we do learn more about it, we will try to contact Karen Gilmore to discuss it further. However, it is important to stay in touch with us  periodically and keep the address and phone number up to date.  ADDITIONAL GENETIC TESTING:  We discussed with Karen Gilmore that her genetic testing was fairly extensive.  If there are genes identified to increase cancer risk that can be analyzed in the future, we would be happy to discuss and coordinate this testing at that time.    CANCER SCREENING RECOMMENDATIONS: Karen  Gilmore's test result is considered negative (normal).  This means that we have not identified a hereditary cause for her personal history of ovarian cancer at this time. Most cancers happen by chance and this negative test suggests that her personal history of cancer may fall into this category.    Possible reasons for Karen Gilmore's negative genetic test include:  1. There may be a gene mutation in one of these genes that current testing methods cannot detect but that chance is small.  2. There could be another gene that has not yet been discovered, or that we have not yet tested, that is responsible for the cancer diagnoses in the family.  3.  There may be no hereditary risk for cancer in the family. The cancers in Karen Gilmore and/or her family may be sporadic/familial or due to other genetic and environmental factors. 4. It is also possible there is a hereditary cause for the cancer in the family that Karen Gilmore did not inherit.  Therefore, it is recommended she continue to follow the cancer management and screening guidelines provided by her oncology and primary healthcare providers. An individual's cancer risk and medical management are not determined by genetic test results alone. Overall cancer risk assessment incorporates additional factors, including personal medical history, family history, and any available genetic information that may result in a personalized plan for cancer prevention and surveillance  An individual's cancer risk and medical management are not determined by genetic test results alone. Overall cancer risk assessment incorporates additional factors, including personal medical history, family history, and any available genetic  information that may result in a personalized plan for cancer prevention and surveillance.  RECOMMENDATIONS FOR FAMILY MEMBERS:  Individuals in this family might be at some increased risk of developing cancer, over the general population risk, simply due  to the family history of cancer.  We recommended women in this family have a yearly mammogram beginning at age 75, or 73 years younger than the earliest onset of cancer, an annual clinical breast exam, and perform monthly breast self-exams. Women in this family should also have a gynecological exam as recommended by their primary provider. All family members should be referred for colonoscopy starting at age 76, or 30 years younger than the earliest onset of cancer.  FOLLOW-UP: Lastly, we discussed with Rhylynn Perdomo that cancer genetics is a rapidly advancing field and it is possible that new genetic tests will be appropriate for her and/or her family members in the future. We encouraged her to remain in contact with cancer genetics on an annual basis so we can update her personal and family histories and let her know of advances in cancer genetics that may benefit this family.   Our contact number was provided. Maddelyn Carne's questions were answered to her satisfaction, and she knows she is welcome to call us  at anytime with additional questions or concerns.   Santana Fryer, MS, CGC  Certified Genetic Counselor  Email: [email or web address]  Phone: [phone]

## 2024-07-30 NOTE — Progress Notes (Signed)
 [facility] OFFICE PROGRESS NOTE  Patient Care Team: Antonio Meth, Jamee SAUNDERS, DO as PCP - General (Family Medicine) Livingston Rigg, MD as Consulting Physician (Dermatology) de Dasie, [place], OD (Optometry) Ob/Gyn, [facility] as Consulting Physician Sarrah Browning, MD as Consulting Physician (Obstetrics and Gynecology)  Assessment & Plan Ovarian cancer on right [facility]) The patient was diagnosed with what looks like stage IIIc right ovarian cancer after presentation with lower back pain in October.  The mass is causing right sided hydronephrosis.  She presented with abnormal imaging study and trace ascites.  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    [name], MD  INTERVAL HISTORY: she returns for surveillance follow-up after cycle 1 of treatment She is here accompanied by her daughter, Ronnald She tolerated cycle 1 of treatment well except for the first few days, she had severe abdominal discomfort, cramping and constipation which subsequently resolved She have lost a lot of weight since her last visit but overall could be related to loss of fluid weight She is not able to have bowel movement without laxatives Her appetite is fair Denies peripheral  neuropathy  PHYSICAL EXAMINATION: ECOG PERFORMANCE STATUS: 1 - Symptomatic but completely ambulatory  Vitals:   07/29/24 1530  BP: (!) 143/87  Pulse: 77  Resp: 18  Temp: 99.6 F (37.6 C)  SpO2: 100%   Filed Weights   07/29/24 1530  Weight: 125 lb 6.4 oz (56.9 kg)

## 2024-07-30 NOTE — Telephone Encounter (Signed)
 I contacted  Karen Gilmore to discuss her genetic testing results. No pathogenic variants were identified in the 77 genes analyzed. One variant of uncertain significance was found in the ALK gene. Discussed that we do not know why she has  cancer or why there is cancer in the family. It could be due to a different gene that we are not testing, or maybe our current technology may not be able to pick something up.  It will be important for her to keep in contact with genetics to keep up with whether additional testing may be needed.Detailed clinic note to follow.   The test report will be scanned into EPIC and will be located under the Molecular Pathology section of the Results Review tab.  A portion of the result report is included below for reference.    Karen Fryer, MS, CGC  Certified Genetic Counselor  Email: [email or web address]  Phone: [phone]

## 2024-08-01 ENCOUNTER — Encounter: Payer: Self-pay | Admitting: Hematology and Oncology

## 2024-08-09 ENCOUNTER — Inpatient Hospital Stay

## 2024-08-09 ENCOUNTER — Encounter: Payer: Self-pay | Admitting: Hematology and Oncology

## 2024-08-09 ENCOUNTER — Inpatient Hospital Stay: Admitting: Hematology and Oncology

## 2024-08-09 VITALS — BP 159/99 | HR 65 | Temp 98.9°F | Resp 18 | Ht 64.0 in | Wt 129.0 lb

## 2024-08-09 DIAGNOSIS — C561 Malignant neoplasm of right ovary: Secondary | ICD-10-CM | POA: Diagnosis not present

## 2024-08-09 DIAGNOSIS — T451X5A Adverse effect of antineoplastic and immunosuppressive drugs, initial encounter: Secondary | ICD-10-CM

## 2024-08-09 DIAGNOSIS — D6481 Anemia due to antineoplastic chemotherapy: Secondary | ICD-10-CM

## 2024-08-09 DIAGNOSIS — Z5111 Encounter for antineoplastic chemotherapy: Secondary | ICD-10-CM | POA: Diagnosis not present

## 2024-08-09 LAB — CBC WITH DIFFERENTIAL (CANCER CENTER ONLY)
Abs Immature Granulocytes: 0.01 K/uL (ref 0.00–0.07)
Basophils Absolute: 0.1 K/uL (ref 0.0–0.1)
Basophils Relative: 1 %
Eosinophils Absolute: 0.1 K/uL (ref 0.0–0.5)
Eosinophils Relative: 2 %
HCT: 35.4 % — ABNORMAL LOW (ref 36.0–46.0)
Hemoglobin: 11.6 g/dL — ABNORMAL LOW (ref 12.0–15.0)
Immature Granulocytes: 0 %
Lymphocytes Relative: 38 %
Lymphs Abs: 1.9 K/uL (ref 0.7–4.0)
MCH: 29.6 pg (ref 26.0–34.0)
MCHC: 32.8 g/dL (ref 30.0–36.0)
MCV: 90.3 fL (ref 80.0–100.0)
Monocytes Absolute: 0.6 K/uL (ref 0.1–1.0)
Monocytes Relative: 12 %
Neutro Abs: 2.3 K/uL (ref 1.7–7.7)
Neutrophils Relative %: 47 %
Platelet Count: 245 K/uL (ref 150–400)
RBC: 3.92 MIL/uL (ref 3.87–5.11)
RDW: 13.5 % (ref 11.5–15.5)
WBC Count: 5 K/uL (ref 4.0–10.5)
nRBC: 0 % (ref 0.0–0.2)

## 2024-08-09 LAB — CMP (CANCER CENTER ONLY)
ALT: 24 U/L (ref 0–44)
AST: 43 U/L — ABNORMAL HIGH (ref 15–41)
Albumin: 3.9 g/dL (ref 3.5–5.0)
Alkaline Phosphatase: 108 U/L (ref 38–126)
Anion gap: 10 (ref 5–15)
BUN: 19 mg/dL (ref 8–23)
CO2: 27 mmol/L (ref 22–32)
Calcium: 9.2 mg/dL (ref 8.9–10.3)
Chloride: 100 mmol/L (ref 98–111)
Creatinine: 0.93 mg/dL (ref 0.44–1.00)
GFR, Estimated: >60 mL/min
Glucose, Bld: 90 mg/dL (ref 70–99)
Potassium: 4.2 mmol/L (ref 3.5–5.1)
Sodium: 137 mmol/L (ref 135–145)
Total Bilirubin: 0.2 mg/dL (ref 0.0–1.2)
Total Protein: 7.4 g/dL (ref 6.5–8.1)

## 2024-08-09 NOTE — Progress Notes (Signed)
 [facility] OFFICE PROGRESS NOTE  Patient Care Team: Antonio Meth, Jamee SAUNDERS, DO as PCP - General (Family Medicine) Livingston Rigg, MD as Consulting Physician (Dermatology) de Dasie, [place], OD (Optometry) Ob/Gyn, [facility] as Consulting Physician Sarrah Browning, MD as Consulting Physician (Obstetrics and Gynecology)  Assessment & Plan Ovarian cancer on right [facility]) The patient was diagnosed with what looks like stage IIIc right ovarian cancer after presentation with lower back pain in October.  The mass is causing right sided hydronephrosis.  She presented with abnormal imaging study and trace ascites.  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, irrelevant to her disease presentation  She tolerated cycle 1 of chemotherapy well except for constipation that lasted a few days but now resolved with aggressive laxative therapy She noticed significant improvement of her symptoms of abdominal bloating and felt that overall the ascites is resolving She has lost a lot of weight but part of it could be related to loss of ascites fluid and recent resolution of constipation We discussed importance of frequent small meals and high-protein intake She will proceed with cycle 2 of treatment tomorrow I plan to order imaging study after cycle 3 of therapy Anemia due to antineoplastic chemotherapy This is likely due to recent treatment. The patient denies recent history of bleeding such as epistaxis, hematuria or hematochezia. She is asymptomatic from the anemia. I will observe for now.  She does not require transfusion now. I will continue the chemotherapy at current dose without dosage adjustment.  If the anemia gets progressive worse in the future, I might have to delay her treatment or adjust the  chemotherapy dose.   No orders of the defined types were placed in this encounter.    [name], MD  INTERVAL HISTORY: she returns for treatment follow-up Complications related to previous cycle of chemotherapy included anemia, and constipation, She is feeling good since her last visit Her bowel habits are normal She denies neuropathy Appetite is good and she has gained some weight since last time I saw her We discussed recent genetic testing, timing of imaging and future surgery  PHYSICAL EXAMINATION: ECOG PERFORMANCE STATUS: 0 - Asymptomatic  Lab Results  Component Value Date   CAN125 5,560.0 (H) 07/16/2024   CAN125 3,753.0 (H) 07/05/2024      Latest Ref Rng & Units 08/09/2024   10:54 AM 07/16/2024    3:23 PM 07/05/2024   11:23 AM  CBC  WBC 4.0 - 10.5 K/uL 5.0  6.7  8.2   Hemoglobin 12.0 - 15.0 g/dL 88.3  86.9  86.5   Hematocrit 36.0 - 46.0 % 35.4  39.5  41.6   Platelets 150 - 400 K/uL 245  417  410       Chemistry      Component Value Date/Time   NA 137 08/09/2024 1054   K 4.2 08/09/2024 1054   CL 100 08/09/2024 1054   CO2 27 08/09/2024 1054   BUN 19 08/09/2024 1054   CREATININE 0.93 08/09/2024 1054      Component Value Date/Time   CALCIUM 9.2 08/09/2024 1054   ALKPHOS 108 08/09/2024 1054   AST 43 (H) 08/09/2024 1054   ALT 24 08/09/2024 1054   BILITOT 0.2 08/09/2024 1054       Vitals:   08/09/24 1128  BP: (!) 159/99  Pulse:  65  Resp: 18  Temp: 98.9 F (37.2 C)  SpO2: 100%   Filed Weights   08/09/24 1128  Weight: 129 lb (58.5 kg)   Other relevant data reviewed during this visit included CBC CMP genetic test

## 2024-08-09 NOTE — Progress Notes (Signed)
 agree

## 2024-08-09 NOTE — Assessment & Plan Note (Addendum)
 The patient was diagnosed with what looks like stage IIIc right ovarian cancer after presentation with lower back pain in October.  The mass is causing right sided hydronephrosis.  She presented with abnormal imaging study and trace ascites.  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, irrelevant to her disease presentation  She tolerated cycle 1 of chemotherapy well except for constipation that lasted a few days but now resolved with aggressive laxative therapy She noticed significant improvement of her symptoms of abdominal bloating and felt that overall the ascites is resolving She has lost a lot of weight but part of it could be related to loss of ascites fluid and recent resolution of constipation We discussed importance of frequent small meals and high-protein intake She will proceed with cycle 2 of treatment tomorrow I plan to order imaging study after cycle 3 of therapy

## 2024-08-09 NOTE — Assessment & Plan Note (Addendum)
 This is likely due to recent treatment. The patient denies recent history of bleeding such as epistaxis, hematuria or hematochezia. She is asymptomatic from the anemia. I will observe for now.  She does not require transfusion now. I will continue the chemotherapy at current dose without dosage adjustment.  If the anemia gets progressive worse in the future, I might have to delay her treatment or adjust the chemotherapy dose.

## 2024-08-09 NOTE — Addendum Note (Signed)
 Encounter addended by: Janice Lynwood BROCKS on: 08/09/2024 9:03 AM  Actions taken: Imaging Exam ended

## 2024-08-10 ENCOUNTER — Inpatient Hospital Stay

## 2024-08-10 VITALS — BP 139/89 | HR 81 | Temp 97.6°F | Resp 18 | Wt 129.0 lb

## 2024-08-10 DIAGNOSIS — Z5111 Encounter for antineoplastic chemotherapy: Secondary | ICD-10-CM | POA: Diagnosis not present

## 2024-08-10 DIAGNOSIS — C561 Malignant neoplasm of right ovary: Secondary | ICD-10-CM

## 2024-08-10 LAB — CA 125: Cancer Antigen (CA) 125: 1633 U/mL — ABNORMAL HIGH (ref 0.0–38.1)

## 2024-08-10 MED ORDER — FAMOTIDINE IN NACL 20-0.9 MG/50ML-% IV SOLN
20.0000 mg | Freq: Once | INTRAVENOUS | Status: AC
  Administered 2024-08-10: 20 mg via INTRAVENOUS
  Filled 2024-08-10: qty 50

## 2024-08-10 MED ORDER — APREPITANT 130 MG/18ML IV EMUL
130.0000 mg | Freq: Once | INTRAVENOUS | Status: AC
  Administered 2024-08-10: 130 mg via INTRAVENOUS
  Filled 2024-08-10: qty 18

## 2024-08-10 MED ORDER — SODIUM CHLORIDE 0.9 % IV SOLN
175.0000 mg/m2 | Freq: Once | INTRAVENOUS | Status: AC
  Administered 2024-08-10: 288 mg via INTRAVENOUS
  Filled 2024-08-10: qty 48

## 2024-08-10 MED ORDER — SODIUM CHLORIDE 0.9 % IV SOLN: INTRAVENOUS | Status: DC

## 2024-08-10 MED ORDER — SODIUM CHLORIDE 0.9 % IV SOLN
456.6000 mg | Freq: Once | INTRAVENOUS | Status: AC
  Administered 2024-08-10: 460 mg via INTRAVENOUS
  Filled 2024-08-10: qty 46

## 2024-08-10 MED ORDER — DEXAMETHASONE SOD PHOSPHATE PF 10 MG/ML IJ SOLN
10.0000 mg | Freq: Once | INTRAMUSCULAR | Status: AC
  Administered 2024-08-10: 10 mg via INTRAVENOUS
  Filled 2024-08-10: qty 1

## 2024-08-10 MED ORDER — PALONOSETRON HCL INJECTION 0.25 MG/5ML
0.2500 mg | Freq: Once | INTRAVENOUS | Status: AC
  Administered 2024-08-10: 0.25 mg via INTRAVENOUS
  Filled 2024-08-10: qty 5

## 2024-08-10 MED ORDER — DIPHENHYDRAMINE HCL 50 MG/ML IJ SOLN
12.5000 mg | Freq: Once | INTRAMUSCULAR | Status: AC
  Administered 2024-08-10: 12.5 mg via INTRAVENOUS
  Filled 2024-08-10: qty 1

## 2024-08-10 NOTE — Patient Instructions (Signed)
 [facility] - A DEPT OF [facility]  Discharge Instructions: Thank you for choosing [facility] to provide your oncology and hematology care.   If you have a lab appointment with the Cancer Center, please go directly to the Cancer Center and check in at the registration area.   Wear comfortable clothing and clothing appropriate for easy access to any Portacath or PICC line.   We strive to give you quality time with your provider. You may need to reschedule your appointment if you arrive late (15 or more minutes).  Arriving late affects you and other patients whose appointments are after yours.  Also, if you miss three or more appointments without notifying the office, you may be dismissed from the clinic at the provider's discretion.      For prescription refill requests, have your pharmacy contact our office and allow 72 hours for refills to be completed.    Today you received the following chemotherapy and/or immunotherapy agents: Paclitaxel  (Taxol ) & Carboplatin  (Paraplatin )      To help prevent nausea and vomiting after your treatment, we encourage you to take your nausea medication as directed.  BELOW ARE SYMPTOMS THAT SHOULD BE REPORTED IMMEDIATELY: *FEVER GREATER THAN 100.4 F (38 C) OR HIGHER *CHILLS OR SWEATING *NAUSEA AND VOMITING THAT IS NOT CONTROLLED WITH YOUR NAUSEA MEDICATION *UNUSUAL SHORTNESS OF BREATH *UNUSUAL BRUISING OR BLEEDING *URINARY PROBLEMS (pain or burning when urinating, or frequent urination) *BOWEL PROBLEMS (unusual diarrhea, constipation, pain near the anus) TENDERNESS IN MOUTH AND THROAT WITH OR WITHOUT PRESENCE OF ULCERS (sore throat, sores in mouth, or a toothache) UNUSUAL RASH, SWELLING OR PAIN  UNUSUAL VAGINAL DISCHARGE OR ITCHING   Items with * indicate a potential emergency and should be followed up as soon as possible or go to the Emergency Department if any problems should occur.  Please show the  CHEMOTHERAPY ALERT CARD or IMMUNOTHERAPY ALERT CARD at check-in to the Emergency Department and triage nurse.  Should you have questions after your visit or need to cancel or reschedule your appointment, please contact [facility] - A DEPT OF [facility]  Dept: [phone]  and follow the prompts.  Office hours are 8:00 a.m. to 4:30 p.m. Monday - Friday. Please note that voicemails left after 4:00 p.m. may not be returned until the following business day.  We are closed weekends and major holidays. You have access to a nurse at all times for urgent questions. Please call the main number to the clinic Dept: [phone] and follow the prompts.   For any non-urgent questions, you may also contact your provider using MyChart. We now offer e-Visits for anyone 41 and older to request care online for non-urgent symptoms. For details visit [email or web address].   Also download the MyChart app! Go to the app store, search MyChart, open the app, select [facility], and log in with your MyChart username and password.

## 2024-08-12 ENCOUNTER — Inpatient Hospital Stay

## 2024-08-12 ENCOUNTER — Other Ambulatory Visit: Payer: Self-pay | Admitting: Oncology

## 2024-08-12 NOTE — Progress Notes (Signed)
 Gynecologic Oncology Multi-Disciplinary Disposition Conference Note  Date of the Conference: 08/12/2024  Patient Name: Karen Gilmore  Referring Provider: Dr. Dasie Primary GYN Oncologist: Dr. 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 Disposition is to 3 cycles neoadjuvant chemotherapy followed by repeat imaging and consideration for interval debulking surgery. Somatic testing to be sent after surgery.   This Multidisciplinary conference took place involving physicians from Gynecologic Oncology, Medical Oncology, Radiation Oncology, Pathology, Radiology along with the Gynecologic Oncology Nurse Practitioner and Gynecologic Oncology Nurse Navigator.  Comprehensive assessment of the patient's malignancy, staging, need for surgery, chemotherapy, radiation therapy, and need for further testing were reviewed. Supportive measures, both inpatient and following discharge were also discussed. The recommended plan of care is documented. Greater than 35 minutes were spent correlating and coordinating this patient's care.

## 2024-08-13 ENCOUNTER — Telehealth: Payer: Self-pay

## 2024-08-13 NOTE — Telephone Encounter (Signed)
 Returned her call. She is complaining of some constipation. Last bm today, that was small. She has already started Miralax BID and Senokot-s 2 tabs TID today. She does not feel she needs anything added for constipation. She will call the office back for questions/concerns.  FYI

## 2024-08-15 ENCOUNTER — Encounter: Payer: Self-pay | Admitting: Hematology and Oncology

## 2024-08-30 ENCOUNTER — Inpatient Hospital Stay: Admitting: Hematology and Oncology

## 2024-08-30 ENCOUNTER — Inpatient Hospital Stay: Attending: Gynecologic Oncology

## 2024-09-02 ENCOUNTER — Inpatient Hospital Stay

## 2024-09-13 ENCOUNTER — Inpatient Hospital Stay: Admitting: Gynecologic Oncology

## 2024-09-20 ENCOUNTER — Inpatient Hospital Stay: Attending: Gynecologic Oncology

## 2024-09-20 ENCOUNTER — Inpatient Hospital Stay: Admitting: Hematology and Oncology

## 2024-09-23 ENCOUNTER — Inpatient Hospital Stay

## 2024-10-04 ENCOUNTER — Inpatient Hospital Stay: Admitting: Gynecologic Oncology

## 2025-01-23 ENCOUNTER — Ambulatory Visit: Admitting: Dermatology

## 2025-03-05 ENCOUNTER — Ambulatory Visit
